# Patient Record
Sex: Male | Born: 1935 | Race: Black or African American | Hispanic: No | Marital: Married | Smoking: Former smoker
Health system: Southern US, Community
[De-identification: ages and names within clinical notes are randomized; demographics above are authoritative.]

## PROBLEM LIST (undated history)

## (undated) DIAGNOSIS — E7800 Pure hypercholesterolemia, unspecified: Secondary | ICD-10-CM

## (undated) DIAGNOSIS — I10 Essential (primary) hypertension: Secondary | ICD-10-CM

## (undated) DIAGNOSIS — N402 Nodular prostate without lower urinary tract symptoms: Secondary | ICD-10-CM

## (undated) HISTORY — DX: Essential (primary) hypertension: I10

## (undated) HISTORY — DX: Pure hypercholesterolemia, unspecified: E78.00

## (undated) HISTORY — DX: Nodular prostate without lower urinary tract symptoms: N40.2

---
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
## 1997-07-28 ENCOUNTER — Ambulatory Visit (HOSPITAL_COMMUNITY): Admission: RE | Admit: 1997-07-28 | Discharge: 1997-07-28 | Payer: Self-pay | Admitting: Internal Medicine

## 2001-03-25 ENCOUNTER — Ambulatory Visit (HOSPITAL_COMMUNITY): Admission: RE | Admit: 2001-03-25 | Discharge: 2001-03-25 | Payer: Self-pay | Admitting: Orthopedic Surgery

## 2001-03-25 ENCOUNTER — Encounter: Payer: Self-pay | Admitting: Orthopedic Surgery

## 2001-08-06 ENCOUNTER — Encounter: Payer: Self-pay | Admitting: Internal Medicine

## 2001-08-06 ENCOUNTER — Encounter: Admission: RE | Admit: 2001-08-06 | Discharge: 2001-08-06 | Payer: Self-pay | Admitting: Internal Medicine

## 2001-11-06 ENCOUNTER — Ambulatory Visit (HOSPITAL_COMMUNITY): Admission: RE | Admit: 2001-11-06 | Discharge: 2001-11-06 | Payer: Self-pay | Admitting: Gastroenterology

## 2005-07-09 ENCOUNTER — Encounter: Admission: RE | Admit: 2005-07-09 | Discharge: 2005-07-09 | Payer: Self-pay | Admitting: Internal Medicine

## 2005-07-13 ENCOUNTER — Encounter: Admission: RE | Admit: 2005-07-13 | Discharge: 2005-07-13 | Payer: Self-pay | Admitting: Internal Medicine

## 2006-08-30 ENCOUNTER — Encounter: Payer: Self-pay | Admitting: Cardiovascular Disease

## 2010-08-16 ENCOUNTER — Ambulatory Visit
Admission: RE | Admit: 2010-08-16 | Discharge: 2010-08-16 | Disposition: A | Discharge status: Home / Self Care | Payer: Medicare Other | Source: Ambulatory Visit | Attending: Internal Medicine | Admitting: Internal Medicine

## 2010-08-16 ENCOUNTER — Other Ambulatory Visit: Payer: Self-pay | Admitting: Internal Medicine

## 2010-08-16 DIAGNOSIS — R05 Cough: Secondary | ICD-10-CM

## 2010-08-19 ENCOUNTER — Emergency Department (HOSPITAL_COMMUNITY)
Admission: EM | Admit: 2010-08-19 | Discharge: 2010-08-19 | Disposition: A | Discharge status: Home / Self Care | Payer: Medicare Other | Attending: Emergency Medicine | Admitting: Emergency Medicine

## 2010-08-19 DIAGNOSIS — L2989 Other pruritus: Secondary | ICD-10-CM | POA: Insufficient documentation

## 2010-08-19 DIAGNOSIS — T63481A Toxic effect of venom of other arthropod, accidental (unintentional), initial encounter: Secondary | ICD-10-CM | POA: Insufficient documentation

## 2010-08-19 DIAGNOSIS — R21 Rash and other nonspecific skin eruption: Secondary | ICD-10-CM | POA: Insufficient documentation

## 2010-08-19 DIAGNOSIS — I10 Essential (primary) hypertension: Secondary | ICD-10-CM | POA: Insufficient documentation

## 2010-08-19 DIAGNOSIS — R229 Localized swelling, mass and lump, unspecified: Secondary | ICD-10-CM | POA: Insufficient documentation

## 2010-08-19 DIAGNOSIS — L298 Other pruritus: Secondary | ICD-10-CM | POA: Insufficient documentation

## 2010-08-19 DIAGNOSIS — T6391XA Toxic effect of contact with unspecified venomous animal, accidental (unintentional), initial encounter: Secondary | ICD-10-CM | POA: Insufficient documentation

## 2011-04-11 ENCOUNTER — Encounter: Payer: Self-pay | Admitting: *Deleted

## 2013-01-12 ENCOUNTER — Encounter: Payer: Self-pay | Admitting: Cardiovascular Disease

## 2013-08-11 ENCOUNTER — Other Ambulatory Visit: Payer: Self-pay | Admitting: Internal Medicine

## 2013-08-11 ENCOUNTER — Ambulatory Visit
Admission: RE | Admit: 2013-08-11 | Discharge: 2013-08-11 | Disposition: A | Discharge status: Home / Self Care | Payer: Medicare Other | Source: Ambulatory Visit | Attending: Internal Medicine | Admitting: Internal Medicine

## 2013-08-11 DIAGNOSIS — R059 Cough, unspecified: Secondary | ICD-10-CM

## 2013-08-11 DIAGNOSIS — R05 Cough: Secondary | ICD-10-CM

## 2014-04-21 DIAGNOSIS — Z0000 Encounter for general adult medical examination without abnormal findings: Secondary | ICD-10-CM | POA: Diagnosis not present

## 2016-05-15 ENCOUNTER — Other Ambulatory Visit: Payer: Self-pay | Admitting: Internal Medicine

## 2016-05-15 DIAGNOSIS — R519 Headache, unspecified: Secondary | ICD-10-CM

## 2016-05-15 DIAGNOSIS — R51 Headache: Principal | ICD-10-CM

## 2016-05-18 ENCOUNTER — Ambulatory Visit
Admission: RE | Admit: 2016-05-18 | Discharge: 2016-05-18 | Disposition: A | Discharge status: Home / Self Care | Payer: Medicare Other | Source: Ambulatory Visit | Attending: Internal Medicine | Admitting: Internal Medicine

## 2016-05-18 DIAGNOSIS — R519 Headache, unspecified: Secondary | ICD-10-CM

## 2016-05-18 DIAGNOSIS — R51 Headache: Principal | ICD-10-CM

## 2016-10-29 ENCOUNTER — Ambulatory Visit (INDEPENDENT_AMBULATORY_CARE_PROVIDER_SITE_OTHER): Payer: Self-pay | Admitting: Orthopaedic Surgery

## 2018-03-17 ENCOUNTER — Ambulatory Visit
Admission: RE | Admit: 2018-03-17 | Discharge: 2018-03-17 | Disposition: A | Discharge status: Home / Self Care | Payer: Medicare Other | Source: Ambulatory Visit | Attending: Internal Medicine | Admitting: Internal Medicine

## 2018-03-17 ENCOUNTER — Other Ambulatory Visit: Payer: Self-pay | Admitting: Internal Medicine

## 2018-03-17 DIAGNOSIS — M25551 Pain in right hip: Secondary | ICD-10-CM

## 2019-06-30 ENCOUNTER — Other Ambulatory Visit: Payer: Self-pay

## 2019-06-30 ENCOUNTER — Other Ambulatory Visit: Payer: Self-pay | Admitting: Internal Medicine

## 2019-06-30 ENCOUNTER — Ambulatory Visit
Admission: RE | Admit: 2019-06-30 | Discharge: 2019-06-30 | Disposition: A | Discharge status: Home / Self Care | Payer: Medicare Other | Source: Ambulatory Visit | Attending: Internal Medicine | Admitting: Internal Medicine

## 2019-06-30 DIAGNOSIS — M25552 Pain in left hip: Secondary | ICD-10-CM

## 2019-10-13 ENCOUNTER — Ambulatory Visit: Payer: Medicare Other | Attending: Critical Care Medicine

## 2019-10-13 DIAGNOSIS — Z23 Encounter for immunization: Secondary | ICD-10-CM

## 2019-10-13 NOTE — Progress Notes (Signed)
° °  Covid-19 Vaccination Clinic  Name:  Corey Kelley    MRN: [id number] DOB: Nov 16, 1927  10/13/2019  [name] was observed post Covid-19 immunization for 15 minutes without incident. He was provided with Vaccine Information Sheet and instruction to access the V-Safe system.   [name] was instructed to call 911 with any severe reactions post vaccine:  Difficulty breathing   Swelling of face and throat   A fast heartbeat   A bad rash all over body   Dizziness and weakness

## 2020-01-25 ENCOUNTER — Other Ambulatory Visit: Payer: Self-pay

## 2020-01-25 ENCOUNTER — Emergency Department (HOSPITAL_COMMUNITY)
Admission: EM | Admit: 2020-01-25 | Discharge: 2020-01-26 | Disposition: A | Discharge status: Home / Self Care | Payer: Medicare Other | Attending: Emergency Medicine | Admitting: Emergency Medicine

## 2020-01-25 ENCOUNTER — Emergency Department (HOSPITAL_COMMUNITY): Payer: Medicare Other

## 2020-01-25 DIAGNOSIS — Z20822 Contact with and (suspected) exposure to covid-19: Secondary | ICD-10-CM | POA: Diagnosis not present

## 2020-01-25 DIAGNOSIS — W108XXA Fall (on) (from) other stairs and steps, initial encounter: Secondary | ICD-10-CM | POA: Diagnosis not present

## 2020-01-25 DIAGNOSIS — K5641 Fecal impaction: Secondary | ICD-10-CM | POA: Insufficient documentation

## 2020-01-25 DIAGNOSIS — I700 Atherosclerosis of aorta: Secondary | ICD-10-CM | POA: Diagnosis not present

## 2020-01-25 DIAGNOSIS — Z9181 History of falling: Secondary | ICD-10-CM | POA: Diagnosis not present

## 2020-01-25 DIAGNOSIS — I6782 Cerebral ischemia: Secondary | ICD-10-CM | POA: Insufficient documentation

## 2020-01-25 DIAGNOSIS — R2681 Unsteadiness on feet: Secondary | ICD-10-CM | POA: Diagnosis not present

## 2020-01-25 DIAGNOSIS — I10 Essential (primary) hypertension: Secondary | ICD-10-CM | POA: Insufficient documentation

## 2020-01-25 DIAGNOSIS — S3992XA Unspecified injury of lower back, initial encounter: Secondary | ICD-10-CM | POA: Diagnosis present

## 2020-01-25 DIAGNOSIS — Z87891 Personal history of nicotine dependence: Secondary | ICD-10-CM | POA: Insufficient documentation

## 2020-01-25 DIAGNOSIS — S32049A Unspecified fracture of fourth lumbar vertebra, initial encounter for closed fracture: Secondary | ICD-10-CM | POA: Insufficient documentation

## 2020-01-25 DIAGNOSIS — Z79899 Other long term (current) drug therapy: Secondary | ICD-10-CM | POA: Insufficient documentation

## 2020-01-25 DIAGNOSIS — R630 Anorexia: Secondary | ICD-10-CM | POA: Diagnosis not present

## 2020-01-25 NOTE — ED Triage Notes (Signed)
Pt states he has no appetite and has had diarrhea

## 2020-01-26 ENCOUNTER — Emergency Department (HOSPITAL_COMMUNITY): Payer: Medicare Other

## 2020-01-26 LAB — COMPREHENSIVE METABOLIC PANEL
ALT: 19 U/L (ref 0–44)
AST: 74 U/L — ABNORMAL HIGH (ref 15–41)
Albumin: 3.9 g/dL (ref 3.5–5.0)
Alkaline Phosphatase: 66 U/L (ref 38–126)
Anion gap: 16 — ABNORMAL HIGH (ref 5–15)
BUN: 13 mg/dL (ref 8–23)
CO2: 19 mmol/L — ABNORMAL LOW (ref 22–32)
Calcium: 9.6 mg/dL (ref 8.9–10.3)
Chloride: 102 mmol/L (ref 98–111)
Creatinine, Ser: 1.05 mg/dL (ref 0.61–1.24)
GFR, Estimated: >60 mL/min (ref >60)
Glucose, Bld: 87 mg/dL (ref 70–99)
Potassium: 4 mmol/L (ref 3.5–5.1)
Sodium: 137 mmol/L (ref 135–145)
Total Bilirubin: 2.1 mg/dL — ABNORMAL HIGH (ref 0.3–1.2)
Total Protein: 7 g/dL (ref 6.5–8.1)

## 2020-01-26 LAB — DIFFERENTIAL
Abs Immature Granulocytes: 0.07 10*3/uL (ref 0.00–0.07)
Basophils Absolute: 0 10*3/uL (ref 0.0–0.1)
Basophils Relative: 0 %
Eosinophils Absolute: 0 10*3/uL (ref 0.0–0.5)
Eosinophils Relative: 0 %
Immature Granulocytes: 1 %
Lymphocytes Relative: 4 %
Lymphs Abs: 0.7 10*3/uL (ref 0.7–4.0)
Monocytes Absolute: 0.9 10*3/uL (ref 0.1–1.0)
Monocytes Relative: 6 %
Neutro Abs: 13.6 10*3/uL — ABNORMAL HIGH (ref 1.7–7.7)
Neutrophils Relative %: 89 %

## 2020-01-26 LAB — CBC
HCT: 50.6 % (ref 39.0–52.0)
Hemoglobin: 17.5 g/dL — ABNORMAL HIGH (ref 13.0–17.0)
MCH: 33.1 pg (ref 26.0–34.0)
MCHC: 34.6 g/dL (ref 30.0–36.0)
MCV: 95.8 fL (ref 80.0–100.0)
Platelets: 215 10*3/uL (ref 150–400)
RBC: 5.28 MIL/uL (ref 4.22–5.81)
RDW: 13.7 % (ref 11.5–15.5)
WBC: 15.3 10*3/uL — ABNORMAL HIGH (ref 4.0–10.5)
nRBC: 0 % (ref 0.0–0.2)

## 2020-01-26 LAB — APTT: aPTT: 33 seconds (ref 24–36)

## 2020-01-26 LAB — PROTIME-INR
INR: 1.1 (ref 0.8–1.2)
Prothrombin Time: 13.7 seconds (ref 11.4–15.2)

## 2020-01-26 LAB — SARS CORONAVIRUS 2 BY RT PCR (HOSPITAL ORDER, PERFORMED IN ~~LOC~~ HOSPITAL LAB): SARS Coronavirus 2: NEGATIVE

## 2020-01-26 MED ORDER — HYDROCODONE-ACETAMINOPHEN 5-325 MG PO TABS
1.0000 | ORAL_TABLET | Freq: Four times a day (QID) | ORAL | 0 refills | Status: DC | PRN
Start: 2020-01-26 — End: 2020-01-26

## 2020-01-26 MED ORDER — HYDROCODONE-ACETAMINOPHEN 5-325 MG PO TABS
1.0000 | ORAL_TABLET | Freq: Once | ORAL | Status: AC
  Administered 2020-01-26: 1 via ORAL
  Filled 2020-01-26: qty 1

## 2020-01-26 MED ORDER — HYDROCODONE-ACETAMINOPHEN 5-325 MG PO TABS: 1.0000 | ORAL_TABLET | Freq: Four times a day (QID) | ORAL | 0 refills | Status: AC | PRN

## 2020-01-26 MED ORDER — IOHEXOL 300 MG/ML  SOLN: 100.0000 mL | Freq: Once | INTRAMUSCULAR | Status: DC | PRN

## 2020-01-26 MED ORDER — IOHEXOL 300 MG/ML  SOLN
100.0000 mL | Freq: Once | INTRAMUSCULAR | Status: AC | PRN
  Administered 2020-01-26: 100 mL via INTRAVENOUS

## 2020-01-26 NOTE — Evaluation (Signed)
Physical Therapy Evaluation Patient Details Name: Corey Kelley MRN: [id number] DOB: 1927-09-23 Today's Date: 01/26/2020   History of Present Illness  Pt is a 87 y.o. M with significant PMH of HTN, HLD who presents after a fall with stable L4 fracture. Per NS, no acute intervention planned.  Clinical Impression  Prior to admission, pt lives with his spouse and is independent. On PT evaluation, pt ambulating 100 feet with a walker at a min guard assist level. Demonstrates improved stability with walker vs without. Does present as a high fall risk based on decreased gait speed, history of falls, and decreased safety awareness. Extensive education provided to pt/pt daughter with demonstration regarding guarding technique (provided gait belt), DME recommendations, and cueing to provide pt during mobility. Pt/pt daughter verbalized understanding. See below for recommendations.    Follow Up Recommendations Home health PT;Supervision/Assistance - 24 hour    Equipment Recommendations  Rolling walker with 5" wheels;3in1 (PT)    Recommendations for Other Services       Precautions / Restrictions Precautions Precautions: Fall Restrictions Weight Bearing Restrictions: No      Mobility  Bed Mobility Overal bed mobility: Needs Assistance Bed Mobility: Supine to Sit;Sit to Supine     Supine to sit: Min guard Sit to supine: Min assist   General bed mobility comments: Min guard-minA for supine <> sit on stretcher in ED    Transfers Overall transfer level: Needs assistance Equipment used: Rolling walker (2 wheeled);None Transfers: Sit to/from Stand Sit to Stand: Min guard         General transfer comment: Min guard to rise to stand from stretcher  Ambulation/Gait Ambulation/Gait assistance: Min assist;Min guard Gait Distance (Feet): 100 Feet Assistive device: Rolling walker (2 wheeled);None Gait Pattern/deviations: Step-through pattern;Decreased stride length;Trunk flexed Gait  velocity: decreased Gait velocity interpretation: <1.8 ft/sec, indicate of risk for recurrent falls General Gait Details: Pt ambulating initial ~5 ft with no AD, and experienced posterior LOB, requiring minA by PT to correct. With RW, pt able to progress to min guard assist level. Cues provided for walker proximity, keeping feet on inside of walker, and segmental turning  Stairs            Wheelchair Mobility    Modified Rankin (Stroke Patients Only)       Balance Overall balance assessment: Needs assistance Sitting-balance support: Feet supported Sitting balance-Leahy Scale: Good     Standing balance support: No upper extremity supported;During functional activity Standing balance-Leahy Scale: Poor                               Pertinent Vitals/Pain Pain Assessment: No/denies pain    Home Living Family/patient expects to be discharged to:: Private residence Living Arrangements: Spouse/significant other Available Help at Discharge: Family;Available 24 hours/day Type of Home: House Home Access: Stairs to enter Entrance Stairs-Rails: Psychiatric nurse of Steps: 3 Home Layout: One level Home Equipment: Clinical cytogeneticist - 2 wheels;Cane - single point      Prior Function Level of Independence: Independent               Hand Dominance        Extremity/Trunk Assessment   Upper Extremity Assessment Upper Extremity Assessment: Overall WFL for tasks assessed    Lower Extremity Assessment Lower Extremity Assessment: Overall WFL for tasks assessed    Cervical / Trunk Assessment Cervical / Trunk Assessment: Kyphotic  Communication   Communication: No difficulties  Cognition  Arousal/Alertness: Awake/alert Behavior During Therapy: WFL for tasks assessed/performed Overall Cognitive Status: Impaired/Different from baseline Area of Impairment: Safety/judgement                         Safety/Judgement: Decreased  awareness of safety;Decreased awareness of deficits     General Comments: A&Ox4, safety cues provided      General Comments      Exercises     Assessment/Plan    PT Assessment Patient needs continued PT services  PT Problem List Decreased strength;Decreased activity tolerance;Decreased balance;Decreased mobility;Decreased safety awareness       PT Treatment Interventions DME instruction;Gait training;Functional mobility training;Stair training;Therapeutic activities;Therapeutic exercise;Balance training;Patient/family education    PT Goals (Current goals can be found in the Care Plan section)  Acute Rehab PT Goals Patient Stated Goal: pt daughter would like him to return to his baseline PT Goal Formulation: With patient/family Time For Goal Achievement: 02/09/20 Potential to Achieve Goals: Good    Frequency Min 3X/week   Barriers to discharge        Co-evaluation               AM-PAC PT "6 Clicks" Mobility  Outcome Measure Help needed turning from your back to your side while in a flat bed without using bedrails?: None Help needed moving from lying on your back to sitting on the side of a flat bed without using bedrails?: A Little Help needed moving to and from a bed to a chair (including a wheelchair)?: A Little Help needed standing up from a chair using your arms (e.g., wheelchair or bedside chair)?: A Little Help needed to walk in hospital room?: A Little Help needed climbing 3-5 steps with a railing? : A Lot 6 Click Score: 18    End of Session Equipment Utilized During Treatment: Gait belt Activity Tolerance: Patient tolerated treatment well Patient left: in bed;with call bell/phone within reach;with family/visitor present Nurse Communication: Mobility status PT Visit Diagnosis: Unsteadiness on feet (R26.81);History of falling (Z91.81)    Time: 2025-4270 PT Time Calculation (min) (ACUTE ONLY): 52 min   Charges:   PT Evaluation $PT Eval Moderate  Complexity: 1 Mod PT Treatments $Gait Training: 8-22 mins $Self Care/Home Management: 8-22        Wyona Almas, PT, DPT Acute Rehabilitation Services Pager [phone] Office [phone]   Deno Etienne 01/26/2020, 4:41 PM

## 2020-01-26 NOTE — ED Notes (Signed)
Patient incont of stool. Patient cleaned up and brief changed.

## 2020-01-26 NOTE — ED Notes (Signed)
Nourishment provided.   ?

## 2020-01-26 NOTE — Consult Note (Signed)
   Providing Compassionate, Quality Care - Together  Neurosurgery Consult  Referring physician: Dr. Dina Rich Reason for referral: L4 Fx  Chief Complaint: GLF  History of Present Illness: This is a 87 yo male with a history of HTN, HLD that presents after ground level fall a few days ago where he fell due to balance. Since then he has had a decreased appetite and was brought to ED with small loose BMs. Denies LOC, denies numbness, tingling in the legs or groin, denies saddle anesthesia. Does complain of generalized weakness. CT revealed a L4 SEP fx with ankylosis throughout, mild displacement, but not posterior or middle column involvement. Denies significant back pain, denies back pain when standing, does have some chronic LBP, especially at night.    Medications: I have reviewed the patient's current medications. Allergies: No Known Allergies  History reviewed. No pertinent family history. Social History:  has no history on file for tobacco use, alcohol use, and drug use.  ROS: Reviewed, pos/neg in HPI  Physical Exam:  Vital signs in last 24 hours: Temp:  [98 F (36.7 C)-98.3 F (36.8 C)] 98 F (36.7 C) (07/25 1814) Pulse Rate:  [58-128] 65 (07/26 0746) Resp:  [11-18] 14 (07/26 0217) BP: (138-182)/(65-125) 153/88 (07/26 0700) SpO2:  [91 %-98 %] 96 % (07/26 0746) PE: AOx3 PERRLA EOMI Face symmetric NAD SILT BUE 4+/5 BLE 4+/5 Non TTP L spine   Impression/Assessment:  87 yo M with:  1. L4 Fracture, stable  -slightly displaced without malalignment, no middle or post column involvement  Plan:  -pain control -no acute intervention -no need for a brace -will have patient f/u 2-3 weeks for eval    Thank you for allowing me to participate in this patient's care.  Please do not hesitate to call with questions or concerns.   Elwin Sleight, [facility] Cell: [phone]

## 2020-01-26 NOTE — ED Notes (Signed)
Discharge instructions communicated with daughter [name], discussed discharge disposition and plans.

## 2020-01-26 NOTE — ED Notes (Signed)
Help get patient on the bed patient is resting with call bed in reach

## 2020-01-26 NOTE — Discharge Instructions (Addendum)
You have been seen and discharged from the emergency department.  You were found to have a fecal impaction as well as an L4 fracture.  Follow-up with your primary provider and listed neurosurgeon for reevaluation.  Take pain medicine as directed.  Do not mix with alcohol.  Do not take this medication and do any heavy activity or drive until you know how it affects you.  It may make you drowsy.  There is Tylenol in this pain medicine.  You will also need to take over-the-counter medications like stool softener/MiraLAX daily to avoid any constipation from the pain medicine.  Take home medications as prescribed. If you have any worsening symptoms or further concerns for health please return to an emergency department for further evaluation.

## 2020-01-26 NOTE — ED Notes (Signed)
Patient cleaned up and brief changed.

## 2020-01-26 NOTE — ED Provider Notes (Signed)
[facility] EMERGENCY DEPARTMENT Provider Note   CSN: [id number] Arrival date & time: 01/25/20  2323     History Chief Complaint  Patient presents with  . Anorexia    Corey Kelley is a 87 y.o. male.  HPI   87 year old male with past medical history of HTN, HLD presents the emergency department with decreased appetite and fall.  Patient states yesterday when he was trying to go up the steps he lost his balance and fell back onto his bottom.  Did not hit his head, had no loss of consciousness, no syncope.  He states all day yesterday and today he has not had anything to eat or drink because he has had no appetite.  He denies any fever, chest pain/shortness of breath/cough, nausea/vomiting.  States he has had small loose watery bowel movements but denies any abdominal pain.  Past Medical History:  Diagnosis Date  . Hypercholesteremia   . Hypertension   . Prostate nodule     There are no problems to display for this patient.       Family History  Problem Relation Age of Onset  . Hypertension Other   . Lung cancer Other   . Coronary artery disease Other   . Heart attack Other     Social History   Tobacco Use  . Smoking status: Former Smoker    Packs/day: 0.50    Years: 4.50    Pack years: 2.25    Quit date: 01/01/1966    Years since quitting: 54.1  Substance Use Topics  . Alcohol use: No    Home Medications Prior to Admission medications   Medication Sig Start Date End Date Taking? Authorizing Provider  hydrochlorothiazide (HYDRODIURIL) 25 MG tablet Take 25 mg by mouth daily.    [provider]  lisinopril (PRINIVIL,ZESTRIL) 20 MG tablet Take 20 mg by mouth daily.    [provider]  lovastatin (MEVACOR) 20 MG tablet Take 20 mg by mouth at bedtime.    [provider]    Allergies    Patient has no known allergies.  Review of Systems   Review of Systems  Constitutional: Positive for appetite change. Negative for  chills and fever.  HENT: Negative for congestion.   Eyes: Negative for visual disturbance.  Respiratory: Negative for cough and shortness of breath.   Cardiovascular: Negative for chest pain.  Gastrointestinal: Positive for diarrhea. Negative for abdominal distention, abdominal pain and vomiting.  Genitourinary: Negative for dysuria.  Musculoskeletal:       +lower back and right hip pain  Skin: Negative for rash.  Neurological: Negative for headaches.    Physical Exam Updated Vital Signs BP (!) 166/91   Pulse 92   Temp (!) 97.5 F (36.4 C) (Oral)   Resp (!) 22   Wt 74.8 kg   SpO2 95%   Physical Exam Vitals and nursing note reviewed.  Constitutional:      Appearance: Normal appearance. He is not toxic-appearing.  HENT:     Head: Normocephalic.     Mouth/Throat:     Mouth: Mucous membranes are moist.  Eyes:     Pupils: Pupils are equal, round, and reactive to light.  Cardiovascular:     Rate and Rhythm: Normal rate.  Pulmonary:     Effort: Pulmonary effort is normal. No respiratory distress.  Abdominal:     Palpations: Abdomen is soft.     Tenderness: There is no abdominal tenderness. There is no guarding.  Musculoskeletal:  General: No deformity.     Comments: No TTP of the right hip, pelvis stable, mild lower back TTP  Skin:    General: Skin is warm.  Neurological:     Mental Status: He is alert and oriented to person, place, and time. Mental status is at baseline.     Comments: Moves BLE with equal strength  Psychiatric:        Mood and Affect: Mood normal.     ED Results / Procedures / Treatments   Labs (all labs ordered are listed, but only abnormal results are displayed) Labs Reviewed  CBC - Abnormal; Notable for the following components:      Result Value   WBC 15.3 (*)    Hemoglobin 17.5 (*)    All other components within normal limits  DIFFERENTIAL - Abnormal; Notable for the following components:   Neutro Abs 13.6 (*)    All other  components within normal limits  COMPREHENSIVE METABOLIC PANEL - Abnormal; Notable for the following components:   CO2 19 (*)    AST 74 (*)    Total Bilirubin 2.1 (*)    Anion gap 16 (*)    All other components within normal limits  SARS CORONAVIRUS 2 BY RT PCR (HOSPITAL ORDER, [facility])  PROTIME-INR  APTT  CBG MONITORING, ED    EKG EKG Interpretation  Date/Time:  Monday January 25 2020 23:41:07 EST Ventricular Rate:  107 PR Interval:  178 QRS Duration: 80 QT Interval:  332 QTC Calculation: 443 R Axis:   -55 Text Interpretation: Sinus tachycardia with occasional Premature ventricular complexes and Fusion complexes Left axis deviation Minimal voltage criteria for LVH, may be normal variant ( R in aVL ) Inferior infarct , age undetermined Anterior infarct , age undetermined Abnormal ECG No old tracing to compare Confirmed by Delora Fuel ([id number]) on 01/26/2020 12:20:07 AM   Radiology CT HEAD WO CONTRAST  Result Date: 01/26/2020 CLINICAL DATA:  Encephalopathy with loss of appetite EXAM: CT HEAD WITHOUT CONTRAST TECHNIQUE: Contiguous axial images were obtained from the base of the skull through the vertex without intravenous contrast. COMPARISON:  05/18/2016 FINDINGS: Brain: There is no mass, hemorrhage or extra-axial collection. There is generalized atrophy without lobar predilection. Hypodensity of the white matter is most commonly associated with chronic microvascular disease. Vascular: No abnormal hyperdensity of the major intracranial arteries or dural venous sinuses. No intracranial atherosclerosis. Skull: The visualized skull base, calvarium and extracranial soft tissues are normal. Sinuses/Orbits: No fluid levels or advanced mucosal thickening of the visualized paranasal sinuses. No mastoid or middle ear effusion. The orbits are normal. IMPRESSION: Generalized atrophy and chronic microvascular ischemia without acute intracranial abnormality. Electronically  Signed   By: Ulyses Jarred M.D.   On: 01/26/2020 00:17   CT ABDOMEN PELVIS W CONTRAST  Result Date: 01/26/2020 CLINICAL DATA:  87 year old male with diarrhea.  Loss of appetite. EXAM: CT ABDOMEN AND PELVIS WITH CONTRAST TECHNIQUE: Multidetector CT imaging of the abdomen and pelvis was performed using the standard protocol following bolus administration of intravenous contrast. CONTRAST:  134mL OMNIPAQUE IOHEXOL 300 MG/ML  SOLN COMPARISON:  [facility] CT Abdomen and Pelvis 08/21/2006 and earlier. FINDINGS: Lower chest: Cardiac size at the upper limits of normal. No pericardial effusion. Negative lung bases; mild lower lobe scarring greater on the left. Hepatobiliary: Small chronic benign cysts in the central liver and at the hepatic dome, the largest on series 3, image 12 is stable since 2007. A similar cyst  of the inferior right hepatic lobe near the gallbladder fossa has regressed since 2007 and 2008. Negative gallbladder. No bile duct enlargement. Pancreas: Negative. Spleen: Negative. Adrenals/Urinary Tract: Normal adrenal glands. Nonobstructed kidneys with symmetric enhancing renal cortex. Exophytic right renal lower pole 3.6 cm probable proteinaceous cyst. There is a small heterogeneously enhancing round 16 mm lesion of the left renal midpole posteriorly (series 3, image 23 and coronal image 112). There is no hydroureter. But the urinary bladder is distended with the bladder dome extending to the level of the umbilicus and estimated overall bladder volume of 650 mL. Additionally there is a small left lateral bladder diverticulum (series 3, image 52). No convincing perivesical stranding. Stomach/Bowel: Moderate to large stool ball in the rectum and distal sigmoid (sagittal image 91) with mild circumferential rectal wall thickening, presacral stranding. Redundant but nondilated upstream sigmoid colon. Gas-filled redundant transverse colon. The descending colon is relatively decompressed.  Redundant hepatic flexure contains stool. Normal appendix suspected on coronal image 58. No large bowel inflammation identified outside of the rectum. No small bowel dilatation. No free air or free fluid identified. Decompressed stomach and duodenum. Vascular/Lymphatic: Aortoiliac calcified atherosclerosis. Tortuous abdominal aorta with mild fusiform enlargement but no bona fide aortic aneurysm (coronal image 69). Major arterial structures in the abdomen and pelvis remain patent. No lymphadenopathy. Reproductive: Negative. Other: Mild presacral inflammatory stranding.  No pelvic free fluid. Musculoskeletal: Chronic benign bone island medial right pubic bones is stable since 2008 (series 4, image 69 today). Unhealed fracture of the right anterior superior L4 vertebral body and superior endplate (sagittal image 105 and coronal image 85) with mildly depressed superior endplate fragment. Underlying L4-L5 interbody ankylosis. Substantial vacuum disc at the adjacent L3-L4 disc space. Bulky L3 inferior endplate Schmorl's node. The L3-L4 posterior elements appear to remain intact and normally aligned. Mild paraspinal soft tissue contusion/inflammation about the fractured L4 body (series 3, image 38). No paraspinal fluid collection. No other acute osseous abnormality identified. IMPRESSION: 1. Acute appearing mildly displaced fracture of the right anterior superior L4 vertebral body and superior endplate. This appears posttraumatic. Underlying L4-L5 interbody ankylosis, and the posterior elements of L3 and L4 appear intact and normally aligned, such that this might be a stable fracture. Mild associated paraspinal soft tissue contusion/inflammation. No paraspinal fluid collection. 2. Superimposed Fecal Impaction with mild perirectal inflammation, and distended urinary bladder (650 mL) likely due to secondary bladder outlet obstruction. No hydronephrosis or hydroureter. 3. There is a small solid enhancing 16 mm left renal  midpole mass compatible with a Small Renal Cell Carcinoma. Recommend Urology follow-up. 4. Aortic Atherosclerosis (ICD10-I70.0). Electronically Signed   By: Genevie Ann M.D.   On: 01/26/2020 04:38    Procedures Fecal disimpaction  Date/Time: 01/26/2020 4:21 PM Performed by: Lorelle Gibbs, DO Authorized by: Lorelle Gibbs, DO  Consent: Verbal consent obtained. Consent given by: patient Patient understanding: patient states understanding of the procedure being performed Imaging studies: imaging studies available Patient identity confirmed: verbally with patient Local anesthesia used: no  Anesthesia: Local anesthesia used: no  Sedation: Patient sedated: no  Patient tolerance: patient tolerated the procedure well with no immediate complications Comments: Moderate amount of semi soft brown stool disimpacted.      Medications Ordered in ED Medications  iohexol (OMNIPAQUE) 300 MG/ML solution 100 mL (has no administration in time range)  iohexol (OMNIPAQUE) 300 MG/ML solution 100 mL (100 mLs Intravenous Contrast Given 01/26/20 0352)    ED Course  I have reviewed the triage vital signs  and the nursing notes.  Pertinent labs & imaging results that were available during my care of the patient were reviewed by me and considered in my medical decision making (see chart for details).    MDM Rules/Calculators/A&P                          Patient presented with complaint of diarrhea and a fall with lower back.  CT the abdomen pelvis shows fecal impaction with bladder outlet obstruction.  I was able to manually disimpact the patient, he tolerated well, enema has been ordered.  The CT also identified an L4 fracture, neurosurgery was consulted.  Dr. Reatha Armour reviewed imaging and evaluated the patient.  They recommend ambulation and weightbearing as tolerated, no back brace.  He will follow-up as an outpatient with neurosurgery.  Physical therapy evaluated the patient, they recommend walker and  home care.  Social work and home care order has been placed, daughter is at bedside.  Patient is pending case management and discharge.  Final Clinical Impression(s) / ED Diagnoses Final diagnoses:  None    Rx / DC Orders ED Discharge Orders    None       Lorelle Gibbs, DO 01/26/20 1622

## 2020-01-26 NOTE — ED Notes (Signed)
Pt's wife Inez Catalina, called and updated. Per wife, she was not the one requesting the update, but the daughter was.

## 2020-01-26 NOTE — ED Notes (Signed)
Contacted pt's daughter, York Cerise @ [phone] with an update as well. Per wife and daughter, York Cerise is to be contacted with updates.

## 2020-01-26 NOTE — ED Notes (Signed)
Patient disimpacted by Dr Dina Rich.  Cleaned patient and changed diaper.  No complaints, pt tolerated well.  No needs voiced by pt at this time.

## 2020-01-28 ENCOUNTER — Telehealth: Payer: Self-pay | Admitting: *Deleted

## 2020-01-28 NOTE — Telephone Encounter (Signed)
Pt daughter called regarding rolling walker for dad.  RNCM advised to either PCP as there is not an order to rolling walker at this time.  Daughter called back stating PCP was able to write order and she will pick up from medical supply store.

## 2020-03-22 ENCOUNTER — Ambulatory Visit: Payer: Medicare Other | Admitting: Neurology

## 2020-03-22 ENCOUNTER — Encounter: Payer: Self-pay | Admitting: Neurology

## 2020-03-22 VITALS — BP 129/81 | HR 82 | Ht 67.0 in | Wt 179.0 lb

## 2020-03-22 DIAGNOSIS — R2689 Other abnormalities of gait and mobility: Secondary | ICD-10-CM

## 2020-03-22 DIAGNOSIS — R296 Repeated falls: Secondary | ICD-10-CM

## 2020-03-22 NOTE — Progress Notes (Signed)
Subjective:    Patient ID: Corey Kelley is a 87 y.o. male.  HPI     Corey Age, MD, PhD [facility] [address], [address] [place], [place] [address]  Dear Dr. Mancel Kelley,   I saw your patient, Corey Kelley, upon your kind request in my neurologic clinic today for initial consultation of his gait disorder and recurrent falls.  The patient is accompanied by his younger daughter, Corey Kelley, today.  As you know, Corey Kelley is a 87 year old right-handed gentleman with an underlying medical history of hypertension, hyperlipidemia, prostate nodule, hearing loss, appetite loss, and L4 fracture, who reports very little of his own history as he is very hard of hearing and his daughter provides his history.  She reports that he has had recurrent falls without any apparent reasons.  These falls have occurred in the past few months.  Since his ER visit in January, he has not had any falls.  He did not need surgery for his vertebral fracture. He is supposed to use a walker but does not use it, feels like he does not need one. She reports that he has a new prescription for a walker but they have misplaced the Medicare insurance card and could not fill the prescription for his walker yet.  He lives with his 84 year old wife.  They have 2 daughters, his older daughter is in [place], younger daughter lives in [place] which is about 20 minutes from them.  He drives although his daughter believes he is not safe to drive.  He has an appointment next month to get evaluated for hearing aids.  He does not hydrate very well.  He does not have a great appetite and does not eat very well.  Per daughter, he probably drinks at most 1 bottle of water per day, 16.9 ounce size. He drinks some sweet tea and strawberry lemonade.  He has not had any sudden onset of one-sided weakness or numbness or tingling or droopy face or slurring of speech.  He denies any pain currently.  He has  seen ENT recently for hearing loss and cerumen impaction.  He was seen in the emergency room at [facility] on 01/25/2020 after a fall.  He was found to have fecal impaction and an L4 fracture.  I reviewed the emergency room records.  He had a head CT without contrast on 01/25/2020 and I reviewed the results: IMPRESSION: Generalized atrophy and chronic microvascular ischemia without acute intracranial abnormality.  He reported diarrhea and loss of appetite.  He had a CT of the abdomen and pelvis with contrast on 01/26/2020 and I reviewed the results: IMPRESSION: 1. Acute appearing mildly displaced fracture of the right anterior superior L4 vertebral body and superior endplate. This appears posttraumatic. Underlying L4-L5 interbody ankylosis, and the posterior elements of L3 and L4 appear intact and normally aligned, such that this might be a stable fracture. Mild associated paraspinal soft tissue contusion/inflammation. No paraspinal fluid collection.   2. Superimposed Fecal Impaction with mild perirectal inflammation, and distended urinary bladder (650 mL) likely due to secondary bladder outlet obstruction. No hydronephrosis or hydroureter.   3. There is a small solid enhancing 16 mm left renal midpole mass compatible with a Small Renal Cell Carcinoma. Recommend Urology follow-up.   4. Aortic Atherosclerosis (ICD10-I70.0).  He had a right hip x-ray on 01/26/2020 and I reviewed the results: IMPRESSION: 1. No acute fracture identified about the pelvis. See L4 fracture details on CT earlier today.  2. Excreted IV contrast in the bladder, distal right ureter, posterior urethra. Small continued bladder distension and multiple bilateral bladder diverticula.  He was treated for his fecal impaction by manual disimpaction and enema.  He had a CMP on 01/25/2020 which showed sodium 137, potassium 4.0, glucose 87, BUN 13, creatinine 1.05, AST 74, ALT 19, total bilirubin 2.1.  Covid test was  negative.  His Past Medical History Is Significant For: Past Medical History:  Diagnosis Date  . Hypercholesteremia   . Hypertension   . Prostate nodule     His Past Surgical History Is Significant For:   His Family History Is Significant For: Family History  Problem Relation Kelley of Onset  . Hypertension Other   . Lung cancer Other   . Coronary artery disease Other   . Heart attack Other     His Social History Is Significant For: Social History   Socioeconomic History  . Marital status: Married    Spouse name: Not on file  . Number of children: 2  . Years of education: Not on file  . Highest education level: Not on file  Occupational History  . Occupation: retired  Tobacco Use  . Smoking status: Former Smoker    Packs/day: 0.50    Years: 4.50    Pack years: 2.25    Quit date: 01/01/1966    Years since quitting: 54.2  . Smokeless tobacco: Never Used  Substance and Sexual Activity  . Alcohol use: No  . Drug use: Not on file  . Sexual activity: Not on file  Other Topics Concern  . Not on file  Social History Narrative  . Not on file   Social Determinants of Health   Financial Resource Strain: Not on file  Food Insecurity: Not on file  Transportation Needs: Not on file  Physical Activity: Not on file  Stress: Not on file  Social Connections: Not on file    His Allergies Are:  No Known Allergies:   His Current Medications Are:  Outpatient Encounter Medications as of 03/22/2020  Medication Sig  . amLODipine (NORVASC) 5 MG tablet Take 5 mg by mouth daily.  . diphenoxylate-atropine (LOMOTIL) 2.5-0.025 MG tablet Take by mouth 4 (four) times daily as needed for diarrhea or loose stools.  Marland Kitchen lisinopril (PRINIVIL,ZESTRIL) 20 MG tablet Take 20 mg by mouth daily.  . tamsulosin (FLOMAX) 0.4 MG CAPS capsule Take 0.4 mg by mouth.  . traMADol (ULTRAM) 50 MG tablet Take 50 mg by mouth 3 (three) times daily as needed.  . [DISCONTINUED] hydrochlorothiazide (HYDRODIURIL)  25 MG tablet Take 25 mg by mouth daily.  . [DISCONTINUED] lovastatin (MEVACOR) 20 MG tablet Take 20 mg by mouth at bedtime.   No facility-administered encounter medications on file as of 03/22/2020.  :   Review of Systems:  Out of a complete 14 point review of systems, all are reviewed and negative with the exception of these symptoms as listed below:    Review of Systems  Neurological:       Patient presents today to discuss his falls and gait change.  After his hospitalization in January, he has not had any falls.  Prior to the hospitalization he had multiple falls.  Patient's daughter has noticed shuffling gait.    Objective:  Neurological Exam  Physical Exam Physical Examination:   Vitals:   03/22/20 1032  BP: 129/81  Pulse: 82    General Examination: The patient is a very pleasant 87 y.o. male in no acute distress.  He appears mildly frail and deconditioned, well-groomed.  HEENT: Normocephalic, atraumatic, pupils are equal, round and reactive to light, status post cataract repairs.  He is very hard of hearing.  Face is symmetric with normal facial animation and normal facial sensation, speech is clear without dysarthria, hypophonia or voice tremor.  Airway examination reveals moderate mouth dryness.  Tongue protrudes centrally and palate elevates symmetrically.  No carotid bruits.  Chest: Clear to auscultation without wheezing, rhonchi or crackles noted.  Heart: S1+S2+0, regular and normal without murmurs, rubs or gallops noted.   Abdomen: Soft, non-tender and non-distended with normal bowel sounds appreciated on auscultation.  Extremities: There is no pitting edema in the distal lower extremities bilaterally.  Skin: Warm and dry without trophic changes noted.  Musculoskeletal: exam reveals no obvious joint deformities, tenderness or joint swelling.   Neurologically:  Mental status: The patient is awake, alert and oriented to self, situation, doctor's office.   Communication is difficult because of his severe hearing impairment.   Cranial nerves II - XII are as described above under HEENT exam. In addition: shoulder shrug is normal with equal shoulder height noted. Motor exam: Thin bulk, global strength of 4+ out of 5, no drift, no resting or postural tremor, Romberg is not tested for safety concerns, reflexes are 1+ in the upper extremities and trace in the lower extremities.  Fine motor skills are globally mildly impaired, no decrement in amplitude noted. Cerebellar testing: No dysmetria or intention tremor on finger to nose testing. Heel to shin is unremarkable bilaterally. There is no truncal or gait ataxia.  Sensory exam: intact to light touch in the upper and lower extremities.  Gait, station and balance: He stands with mild difficulty, posture is mildly stooped, not unusual for Kelley.  He walks slowly and cautiously, no walking aid.  No shuffling, he has preserved arm swing.  He turns slowly.  Denies any lightheadedness or vertiginous symptoms upon standing.  Assessment and Plan:  In summary, Helix Lafontaine is a very pleasant 87 y.o.-year old male with an underlying medical history of hypertension, hyperlipidemia, prostate nodule, hearing loss, appetite loss, and L4 fracture, who presents for evaluation of his gait disorder and recent falls.  History and examination do not support a primary, or single neurological cause for his falls.  His gait disorder is likely secondary to combination of factors including prior falls with injuries, suboptimal hydration, deconditioning.  History and examination do not support underlying parkinsonism.  They are reassured in that regard.  Nevertheless, he does have severe hearing loss and fine motor dyscontrol in the mild range, nonspecific and is therefore advised no longer to drive.  He is advised to stand up slowly and get his bearings first, stay better hydrated with water and use his walker at all times.  He has an older  walker at home and they are trying to get a new walker for him.  I do believe he will benefit from hearing aids.  There was some evidence of arthritic changes in his spine.  He had bladder outlet obstruction and severe constipation/obstipation recently.  I think these are all contributors.  We talked about the importance of healthy lifestyle, good nutrition, trying to get enough rest, good hydration and fall precaution. He is advised to follow-up with his audiologist and with your office as scheduled.  I would be happy to see him back in this clinic on an as-needed basis.  I answered all their questions today and the patient and his daughter  were in agreement.   Thank you very much for allowing me to participate in the care of this nice patient. If I can be of any further assistance to you please do not hesitate to call me at [phone].  Sincerely,   Corey Age, MD, PhD

## 2020-03-22 NOTE — Patient Instructions (Addendum)
It was nice to meet you and Desiree today.   I do not believe there is one specific underlying primary neurological reason for your falls. Your gait disorder and falls are likely due to a combination of factors. These factors include: normal aging, degenerative arthritis of your back, prior falls with injuries, suboptimal hydration with water.   You have mild fine motor difficulty with slow movements.  I do not see any evidence of Parkinson's-like changes.  Nevertheless, I do not believe you are safe to drive.  Therefore, I do not recommend that you drive any longer.  Please remember to stand up slowly and get your bearings first turn slowly, no bending down to pick anything, no heavy lifting, be extra careful at night and first thing in the morning. Also, be careful in the Bathroom and the kitchen.   Remember to drink plenty of fluid, eat healthy meals and do not skip any meals. Try to eat protein with a every meal and eat a healthy snack such as fruit or nuts or yogurt in between meals. Try to keep a regular sleep-wake schedule and try to exercise daily, particularly in the form of walking, within your limitations.    Please try to drink at least 6 cups of water per day, at the most, 8 cups. As far as your medications are concerned, I would like to suggest no new medications.   Please use your walker at all times.  I do believe you will benefit from hearing aids.  Please keep your appointment with the audiologist next month and follow-up with your primary care on a regular basis.  We can see you back in this office on an as-needed basis.

## 2021-10-17 ENCOUNTER — Other Ambulatory Visit: Payer: Self-pay | Admitting: Urology

## 2021-10-17 DIAGNOSIS — D3002 Benign neoplasm of left kidney: Secondary | ICD-10-CM

## 2021-11-07 ENCOUNTER — Ambulatory Visit
Admission: RE | Admit: 2021-11-07 | Discharge: 2021-11-07 | Disposition: A | Discharge status: Home / Self Care | Payer: Medicare Other | Source: Ambulatory Visit | Attending: Urology | Admitting: Urology

## 2021-11-07 DIAGNOSIS — D3002 Benign neoplasm of left kidney: Secondary | ICD-10-CM

## 2021-11-07 MED ORDER — GADOPICLENOL 0.5 MMOL/ML IV SOLN
9.0000 mL | Freq: Once | INTRAVENOUS | Status: AC | PRN
  Administered 2021-11-07: 9 mL via INTRAVENOUS

## 2021-12-26 ENCOUNTER — Encounter (HOSPITAL_COMMUNITY): Payer: Self-pay | Admitting: Emergency Medicine

## 2021-12-26 ENCOUNTER — Other Ambulatory Visit: Payer: Self-pay

## 2021-12-26 ENCOUNTER — Observation Stay (HOSPITAL_COMMUNITY): Payer: Medicare Other

## 2021-12-26 ENCOUNTER — Emergency Department (HOSPITAL_COMMUNITY): Payer: Medicare Other

## 2021-12-26 ENCOUNTER — Inpatient Hospital Stay (HOSPITAL_COMMUNITY)
Admission: EM | Admit: 2021-12-26 | Discharge: 2021-12-31 | DRG: 683 | Disposition: A | Discharge status: Skilled Nursing Facility | Payer: Medicare Other | Attending: Internal Medicine | Admitting: Internal Medicine

## 2021-12-26 DIAGNOSIS — I10 Essential (primary) hypertension: Secondary | ICD-10-CM | POA: Diagnosis present

## 2021-12-26 DIAGNOSIS — S80212A Abrasion, left knee, initial encounter: Secondary | ICD-10-CM | POA: Diagnosis present

## 2021-12-26 DIAGNOSIS — E860 Dehydration: Secondary | ICD-10-CM | POA: Diagnosis present

## 2021-12-26 DIAGNOSIS — N400 Enlarged prostate without lower urinary tract symptoms: Secondary | ICD-10-CM | POA: Diagnosis present

## 2021-12-26 DIAGNOSIS — J111 Influenza due to unidentified influenza virus with other respiratory manifestations: Secondary | ICD-10-CM | POA: Diagnosis present

## 2021-12-26 DIAGNOSIS — E7800 Pure hypercholesterolemia, unspecified: Secondary | ICD-10-CM | POA: Diagnosis present

## 2021-12-26 DIAGNOSIS — R600 Localized edema: Secondary | ICD-10-CM | POA: Diagnosis present

## 2021-12-26 DIAGNOSIS — Z23 Encounter for immunization: Secondary | ICD-10-CM

## 2021-12-26 DIAGNOSIS — R296 Repeated falls: Secondary | ICD-10-CM | POA: Diagnosis present

## 2021-12-26 DIAGNOSIS — Z993 Dependence on wheelchair: Secondary | ICD-10-CM

## 2021-12-26 DIAGNOSIS — N401 Enlarged prostate with lower urinary tract symptoms: Secondary | ICD-10-CM | POA: Diagnosis present

## 2021-12-26 DIAGNOSIS — R338 Other retention of urine: Secondary | ICD-10-CM | POA: Diagnosis present

## 2021-12-26 DIAGNOSIS — Z8249 Family history of ischemic heart disease and other diseases of the circulatory system: Secondary | ICD-10-CM

## 2021-12-26 DIAGNOSIS — F0390 Unspecified dementia without behavioral disturbance: Secondary | ICD-10-CM | POA: Diagnosis present

## 2021-12-26 DIAGNOSIS — M6282 Rhabdomyolysis: Secondary | ICD-10-CM | POA: Diagnosis present

## 2021-12-26 DIAGNOSIS — R5381 Other malaise: Secondary | ICD-10-CM | POA: Diagnosis not present

## 2021-12-26 DIAGNOSIS — Z1152 Encounter for screening for COVID-19: Secondary | ICD-10-CM

## 2021-12-26 DIAGNOSIS — J101 Influenza due to other identified influenza virus with other respiratory manifestations: Secondary | ICD-10-CM | POA: Diagnosis present

## 2021-12-26 DIAGNOSIS — E440 Moderate protein-calorie malnutrition: Secondary | ICD-10-CM | POA: Insufficient documentation

## 2021-12-26 DIAGNOSIS — N179 Acute kidney failure, unspecified: Secondary | ICD-10-CM | POA: Diagnosis not present

## 2021-12-26 DIAGNOSIS — Z801 Family history of malignant neoplasm of trachea, bronchus and lung: Secondary | ICD-10-CM

## 2021-12-26 DIAGNOSIS — R531 Weakness: Secondary | ICD-10-CM | POA: Diagnosis not present

## 2021-12-26 DIAGNOSIS — Y92009 Unspecified place in unspecified non-institutional (private) residence as the place of occurrence of the external cause: Secondary | ICD-10-CM

## 2021-12-26 DIAGNOSIS — E876 Hypokalemia: Secondary | ICD-10-CM | POA: Diagnosis present

## 2021-12-26 DIAGNOSIS — Z87891 Personal history of nicotine dependence: Secondary | ICD-10-CM

## 2021-12-26 DIAGNOSIS — W1830XA Fall on same level, unspecified, initial encounter: Secondary | ICD-10-CM | POA: Diagnosis present

## 2021-12-26 DIAGNOSIS — K56609 Unspecified intestinal obstruction, unspecified as to partial versus complete obstruction: Secondary | ICD-10-CM | POA: Diagnosis present

## 2021-12-26 LAB — COMPREHENSIVE METABOLIC PANEL
ALT: 25 U/L (ref 0–44)
AST: 92 U/L — ABNORMAL HIGH (ref 15–41)
Albumin: 3.7 g/dL (ref 3.5–5.0)
Alkaline Phosphatase: 47 U/L (ref 38–126)
Anion gap: 8 (ref 5–15)
BUN: 21 mg/dL (ref 8–23)
CO2: 25 mmol/L (ref 22–32)
Calcium: 9.1 mg/dL (ref 8.9–10.3)
Chloride: 101 mmol/L (ref 98–111)
Creatinine, Ser: 1.35 mg/dL — ABNORMAL HIGH (ref 0.61–1.24)
GFR, Estimated: 49 mL/min — ABNORMAL LOW (ref >60)
Glucose, Bld: 112 mg/dL — ABNORMAL HIGH (ref 70–99)
Potassium: 3.4 mmol/L — ABNORMAL LOW (ref 3.5–5.1)
Sodium: 134 mmol/L — ABNORMAL LOW (ref 135–145)
Total Bilirubin: 1.1 mg/dL (ref 0.3–1.2)
Total Protein: 6.8 g/dL (ref 6.5–8.1)

## 2021-12-26 LAB — CBC WITH DIFFERENTIAL/PLATELET
Abs Immature Granulocytes: 0.02 10*3/uL (ref 0.00–0.07)
Basophils Absolute: 0 10*3/uL (ref 0.0–0.1)
Basophils Relative: 0 %
Eosinophils Absolute: 0 10*3/uL (ref 0.0–0.5)
Eosinophils Relative: 0 %
HCT: 47.3 % (ref 39.0–52.0)
Hemoglobin: 16.2 g/dL (ref 13.0–17.0)
Immature Granulocytes: 0 %
Lymphocytes Relative: 7 %
Lymphs Abs: 0.5 10*3/uL — ABNORMAL LOW (ref 0.7–4.0)
MCH: 33.1 pg (ref 26.0–34.0)
MCHC: 34.2 g/dL (ref 30.0–36.0)
MCV: 96.7 fL (ref 80.0–100.0)
Monocytes Absolute: 0.8 10*3/uL (ref 0.1–1.0)
Monocytes Relative: 10 %
Neutro Abs: 6.5 10*3/uL (ref 1.7–7.7)
Neutrophils Relative %: 83 %
Platelets: 163 10*3/uL (ref 150–400)
RBC: 4.89 MIL/uL (ref 4.22–5.81)
RDW: 13.5 % (ref 11.5–15.5)
WBC: 7.9 10*3/uL (ref 4.0–10.5)
nRBC: 0 % (ref 0.0–0.2)

## 2021-12-26 LAB — LACTIC ACID, PLASMA: Lactic Acid, Venous: 1.8 mmol/L (ref 0.5–1.9)

## 2021-12-26 LAB — APTT: aPTT: 32 seconds (ref 24–36)

## 2021-12-26 LAB — PROTIME-INR
INR: 1.1 (ref 0.8–1.2)
Prothrombin Time: 14.2 seconds (ref 11.4–15.2)

## 2021-12-26 LAB — RESP PANEL BY RT-PCR (RSV, FLU A&B, COVID)  RVPGX2
Influenza A by PCR: POSITIVE — AB
Influenza B by PCR: NEGATIVE
Resp Syncytial Virus by PCR: NEGATIVE
SARS Coronavirus 2 by RT PCR: NEGATIVE

## 2021-12-26 MED ORDER — OSELTAMIVIR PHOSPHATE 30 MG PO CAPS
30.0000 mg | ORAL_CAPSULE | Freq: Two times a day (BID) | ORAL | Status: DC
  Administered 2021-12-27 – 2021-12-29 (×6): 30 mg via ORAL
  Filled 2021-12-26 (×8): qty 1

## 2021-12-26 MED ORDER — LACTATED RINGERS IV BOLUS (SEPSIS)
500.0000 mL | Freq: Once | INTRAVENOUS | Status: AC
  Administered 2021-12-26: 500 mL via INTRAVENOUS

## 2021-12-26 MED ORDER — POTASSIUM CHLORIDE 10 MEQ/100ML IV SOLN
10.0000 meq | INTRAVENOUS | Status: AC
  Administered 2021-12-27: 10 meq via INTRAVENOUS
  Filled 2021-12-26: qty 100

## 2021-12-26 NOTE — Assessment & Plan Note (Addendum)
Due to dehydration  Obtain urine electrolytes Follow renal function  Assess for urinary retention  Hold lisinopril

## 2021-12-26 NOTE — Subjective & Objective (Signed)
Pt have had 3 falls today had to call EMS to get him up from the ground Not sure if he hit his head BP 146/72 Denies CP , SOB or fever but has been coughing   Has a right leg swelling

## 2021-12-26 NOTE — Assessment & Plan Note (Signed)
Allow permissive HTN 

## 2021-12-26 NOTE — ED Triage Notes (Addendum)
Per EMS, Pt from home w/ family has been experiencing increased falls (but none today.)  Family reports pt has had increased weakness and is now only able to walk w/ a walker, when asked how long family states the change happened the response is "5 years."     Negative Stroke screen, pt does display some weakness but is bilateral.  Pt is able to answer some orientation questions however denies any problems and is not exactly sure why he is here.  146/72 94%RA CBG 116

## 2021-12-26 NOTE — H&P (Signed)
Corey Kelley NIO:[id number] DOB: 11-14-1927 DOA: 12/26/2021     PCP: Lorene Dy, MD   Goes to [facility]    Patient arrived to ER on 12/26/21 at 2030 Referred by Attending Deno Etienne, DO   Patient coming from:    home Lives With family    Chief Complaint:   Chief Complaint  Patient presents with   Weakness    HPI: Corey Kelley is a 88 y.o. male with medical history significant of Dementia, HTN, HLD, BPH    Presented with  falls, fatigue Pt have had 3 falls today had to call EMS to get him up from the ground Not sure if he hit his head BP 146/72 Denies CP , SOB or fever but has been coughing   Has a right leg swelling   Family feels that both legs have been swollen for months but right more than left  It has been interfering with his walking  Pt unable to provide hx  But no fever no diarrhea  He used to smoke but no any more does not drink His wife has been sick recently as well   Initial COVID TEST  NEGATIVE POSITIVE,     Lab Results  Component Value Date   [place] 12/26/2021   [place] NEGATIVE 01/26/2020     Regarding pertinent Chronic problems:     Hyperlipidemia -  on statins LOVASTATIN      HTN on Lisinopril, NOrvasc      While in ER: Noted to have AKI with creatinine up to 1.3 mild hypokalemia with potassium down to 3.4 positive for flu A but no evidence of pneumonia   Femur/Tibia/fibula/ foot . No acute fracture or dislocation. 2. Moderate arthritic changes of the right knee. 3. Diffuse subcutaneous edema and soft tissue swelling of the dorsum of the foot.  CT HEAD Chronic atrophic and ischemic changes without acute abnormality   CXR - chronic bronchitic thickening     Following Medications were ordered in ER: Medications  lactated ringers bolus 500 mL (0 mLs Intravenous Stopped 12/26/21 2231)    KUB showing Prominent gas-filled loops of small and large bowel. If there is concern for obstruction CT ___________    ED  Triage Vitals  Enc Vitals Group     BP 12/26/21 2115 137/80     Pulse Rate 12/26/21 2115 (!) 102     Resp 12/26/21 2115 17     Temp 12/26/21 2115 98 F (36.7 C)     Temp Source 12/26/21 2115 Oral     SpO2 12/26/21 2115 98 %     Weight --      Height --      Head Circumference --      Peak Flow --      Pain Score 12/26/21 2054 0     Pain Loc --      Pain Edu? --      Excl. in [place]? --   TMAX(24)@     _________________________________________ Significant initial  Findings: Abnormal Labs Reviewed  RESP PANEL BY RT-PCR (RSV, FLU A&B, COVID)  RVPGX2 - Abnormal; Notable for the following components:      Result Value   Influenza A by PCR POSITIVE (*)    All other components within normal limits  COMPREHENSIVE METABOLIC PANEL - Abnormal; Notable for the following components:   Sodium 134 (*)    Potassium 3.4 (*)    Glucose, Bld 112 (*)    Creatinine, Ser 1.35 (*)  AST 92 (*)    GFR, Estimated 49 (*)    All other components within normal limits  CBC WITH DIFFERENTIAL/PLATELET - Abnormal; Notable for the following components:   Lymphs Abs 0.5 (*)    All other components within normal limits     _________________________ Troponin  ordered ECG: Ordered Personally reviewed and interpreted by me showing: HR :  104 Rhythm:  Sinus tachycardia Borderline prolonged PR interval Inferior infarct, old Baseline wander in lead(s) V1 QTC 454   The recent clinical data is shown below. Vitals:   12/26/21 2115 12/26/21 2130 12/26/21 2215 12/26/21 2230  BP: 137/80 (!) 146/92 137/83 (!) 132/97  Pulse: (!) 102 (!) 105 (!) 101 (!) 102  Resp: 17     Temp: 98 F (36.7 C)     TempSrc: Oral     SpO2: 98% 98% 95% 100%    WBC     Component Value Date/Time   WBC 7.9 12/26/2021 2102   LYMPHSABS 0.5 (L) 12/26/2021 2102   MONOABS 0.8 12/26/2021 2102   EOSABS 0.0 12/26/2021 2102   BASOSABS 0.0 12/26/2021 2102    Lactic Acid, Venous    Component Value Date/Time   LATICACIDVEN 1.8  12/26/2021 2102      UA   ordered     Results for orders placed or performed during the hospital encounter of 12/26/21  Resp panel by RT-PCR (RSV, Flu A&B, Covid) Anterior Nasal Swab     Status: Abnormal   Collection Time: 12/26/21  9:09 PM   Specimen: Anterior Nasal Swab  Result Value Ref Range Status   SARS Coronavirus 2 by RT PCR NEGATIVE NEGATIVE Final         Influenza A by PCR POSITIVE (A) NEGATIVE Final   Influenza B by PCR NEGATIVE NEGATIVE Final         Resp Syncytial Virus by PCR NEGATIVE NEGATIVE Final          _______________________________________________ Hospitalist was called for admission for AKI dehydration in the setting of Influenza A The following Work up has been ordered so far:  Orders Placed This Encounter  Procedures   Urine Culture   Resp panel by RT-PCR (RSV, Flu A&B, Covid) Anterior Nasal Swab   DG Chest Port 1 View   CT Head Wo Contrast   DG Tibia/Fibula Right   DG Foot Complete Right   DG Femur Min 2 Views Right   Lactic acid, plasma   Comprehensive metabolic panel   CBC with Differential   Protime-INR   APTT   Urinalysis, Routine w reflex microscopic   Diet NPO time specified   Cardiac monitoring   Document height and weight   Assess and Document Glasgow Coma Scale   Document vital signs within 1-hour of fluid bolus completion.  Notify provider of abnormal vital signs despite fluid resuscitation.   Refer to Sidebar Report: Sepsis Bundle ED/IP   Notify provider for difficulties obtaining IV access   Initiate Carrier Fluid Protocol   Consult to hospitalist   Pulse oximetry, continuous   ED EKG 12-Lead   EKG 12-Lead   Insert peripheral IV X 1   VAS [place] LOWER EXTREMITY VENOUS (DVT)     OTHER Significant initial  Findings:  labs showing:  Recent Labs  Lab 12/26/21 2102  NA 134*  K 3.4*  CO2 25  GLUCOSE 112*  BUN 21  CREATININE 1.35*  CALCIUM 9.1    Cr  Up from baseline see below Lab Results  Component Value  Date    CREATININE 1.35 (H) 12/26/2021   CREATININE 1.05 01/25/2020    Recent Labs  Lab 12/26/21 2102  AST 92*  ALT 25  ALKPHOS 47  BILITOT 1.1  PROT 6.8  ALBUMIN 3.7   Lab Results  Component Value Date   CALCIUM 9.1 12/26/2021    Plt: Lab Results  Component Value Date   PLT 163 12/26/2021    Recent Labs  Lab 12/26/21 2102  WBC 7.9  NEUTROABS 6.5  HGB 16.2  HCT 47.3  MCV 96.7  PLT 163    HG/HCT  stable      Component Value Date/Time   HGB 16.2 12/26/2021 2102   HCT 47.3 12/26/2021 2102   MCV 96.7 12/26/2021 2102      Cultures: No results found for: "SDES", "SPECREQUEST", "CULT", "REPTSTATUS"   Radiological Exams on Admission: DG Abd 1 View  Result Date: 12/26/2021 CLINICAL DATA:  Weakness.  Constipation EXAM: ABDOMEN - 1 VIEW COMPARISON:  CT abdomen and pelvis 01/26/2020 FINDINGS: Prominent gas-filled loops of small and large bowel without definite dilation. Exam is limited by overlapping structures in the pelvis. No definite acute osseous abnormality. IMPRESSION: Prominent gas-filled loops of small and large bowel. If there is concern for obstruction CT is recommended for further evaluation. Electronically Signed   By: Placido Sou M.D.   On: 12/26/2021 23:50   CT Head Wo Contrast  Result Date: 12/26/2021 CLINICAL DATA:  Recent falls EXAM: CT HEAD WITHOUT CONTRAST TECHNIQUE: Contiguous axial images were obtained from the base of the skull through the vertex without intravenous contrast. RADIATION DOSE REDUCTION: This exam was performed according to the departmental dose-optimization program which includes automated exposure control, adjustment of the mA and/or kV according to patient size and/or use of iterative reconstruction technique. COMPARISON:  01/26/2020 FINDINGS: Brain: No evidence of acute infarction, hemorrhage, hydrocephalus, extra-axial collection or mass lesion/mass effect. Chronic atrophic and ischemic changes are noted. Vascular: No hyperdense vessel or  unexpected calcification. Skull: Normal. Negative for fracture or focal lesion. Sinuses/Orbits: No acute finding. Other: None. IMPRESSION: Chronic atrophic and ischemic changes without acute abnormality. Electronically Signed   By: Inez Catalina M.D.   On: 12/26/2021 22:09   DG Tibia/Fibula Right  Result Date: 12/26/2021 CLINICAL DATA:  Fall and trauma to the right lower extremity. EXAM: RIGHT TIBIA AND FIBULA - 2 VIEW; RIGHT FEMUR 2 VIEWS; RIGHT FOOT COMPLETE - 3+ VIEW COMPARISON:  Right hip radiograph dated 01/26/2020. FINDINGS: Evaluation is limited due to body habitus and soft tissue attenuation. There is no acute fracture or dislocation. There is moderate arthritic changes of the right knee. There is diffuse subcutaneous edema and soft tissue swelling of the dorsum of the foot. No radiopaque foreign object or soft tissue gas. IMPRESSION: 1. No acute fracture or dislocation. 2. Moderate arthritic changes of the right knee. 3. Diffuse subcutaneous edema and soft tissue swelling of the dorsum of the foot. Electronically Signed   By: Anner Crete M.D.   On: 12/26/2021 22:07   DG Foot Complete Right  Result Date: 12/26/2021 CLINICAL DATA:  Fall and trauma to the right lower extremity. EXAM: RIGHT TIBIA AND FIBULA - 2 VIEW; RIGHT FEMUR 2 VIEWS; RIGHT FOOT COMPLETE - 3+ VIEW COMPARISON:  Right hip radiograph dated 01/26/2020. FINDINGS: Evaluation is limited due to body habitus and soft tissue attenuation. There is no acute fracture or dislocation. There is moderate arthritic changes of the right knee. There is diffuse subcutaneous edema and soft tissue swelling of the  dorsum of the foot. No radiopaque foreign object or soft tissue gas. IMPRESSION: 1. No acute fracture or dislocation. 2. Moderate arthritic changes of the right knee. 3. Diffuse subcutaneous edema and soft tissue swelling of the dorsum of the foot. Electronically Signed   By: Anner Crete M.D.   On: 12/26/2021 22:07   DG Femur Min 2  Views Right  Result Date: 12/26/2021 CLINICAL DATA:  Fall and trauma to the right lower extremity. EXAM: RIGHT TIBIA AND FIBULA - 2 VIEW; RIGHT FEMUR 2 VIEWS; RIGHT FOOT COMPLETE - 3+ VIEW COMPARISON:  Right hip radiograph dated 01/26/2020. FINDINGS: Evaluation is limited due to body habitus and soft tissue attenuation. There is no acute fracture or dislocation. There is moderate arthritic changes of the right knee. There is diffuse subcutaneous edema and soft tissue swelling of the dorsum of the foot. No radiopaque foreign object or soft tissue gas. IMPRESSION: 1. No acute fracture or dislocation. 2. Moderate arthritic changes of the right knee. 3. Diffuse subcutaneous edema and soft tissue swelling of the dorsum of the foot. Electronically Signed   By: Anner Crete M.D.   On: 12/26/2021 22:07   DG Chest Port 1 View  Result Date: 12/26/2021 CLINICAL DATA:  Weakness. Questionable sepsis evaluate for abnormality EXAM: PORTABLE CHEST 1 VIEW COMPARISON:  08/11/2013 FINDINGS: Low lung volumes. Stable cardiomediastinal silhouette. Aortic atherosclerotic calcification. Bibasilar atelectasis. Unchanged chronic bronchitic thickening. No displaced rib fractures. IMPRESSION: Unchanged chronic bronchitic thickening. Electronically Signed   By: Placido Sou M.D.   On: 12/26/2021 21:01   _______________________________________________________________________________________________________ Latest  Blood pressure (!) 132/97, pulse (!) 102, temperature 98 F (36.7 C), temperature source Oral, resp. rate 17, SpO2 100 %.   Vitals  labs and radiology finding personally reviewed  Review of Systems:    Pertinent positives include:    fatigue, falls leg swelling   Constitutional:  No weight loss, night sweats, Fevers, chills weight loss  HEENT:  No headaches, Difficulty swallowing,Tooth/dental problems,Sore throat,  No sneezing, itching, ear ache, nasal congestion, post nasal drip,  Cardio-vascular:  No  chest pain, Orthopnea, PND, anasarca, dizziness, palpitations.no Bilateral lower extremity swelling  GI:  No heartburn, indigestion, abdominal pain, nausea, vomiting, diarrhea, change in bowel habits, loss of appetite, melena, blood in stool, hematemesis Resp:  no shortness of breath at rest. No dyspnea on exertion, No excess mucus, no productive cough, No non-productive cough, No coughing up of blood.No change in color of mucus.No wheezing. Skin:  no rash or lesions. No jaundice GU:  no dysuria, change in color of urine, no urgency or frequency. No straining to urinate.  No flank pain.  Musculoskeletal:  No joint pain or no joint swelling. No decreased range of motion. No back pain.  Psych:  No change in mood or affect. No depression or anxiety. No memory loss.  Neuro: no localizing neurological complaints, no tingling, no weakness, no double vision, no gait abnormality, no slurred speech, no confusion  All systems reviewed and apart from [place] all are negative _______________________________________________________________________________________________ Past Medical History:   Past Medical History:  Diagnosis Date   Hypercholesteremia    Hypertension    Prostate nodule      Past Surgical History:  Procedure Laterality Date   HERNIA REPAIR  1950's    Social History:  Ambulatory  walker  wheelchair bound      reports that he quit smoking about 56 years ago. He has a 2.25 pack-year smoking history. He has never used smokeless tobacco. He reports  that he does not drink alcohol. No history on file for drug use.   Family History:  Family History  Problem Relation Age of Onset   Hypertension Other    Lung cancer Other    Coronary artery disease Other    Heart attack Other    ______________________________________________________________________________________________ Allergies: No Known Allergies   Prior to Admission medications   Medication Sig Start Date End Date  Taking? Authorizing Provider  amLODipine (NORVASC) 5 MG tablet Take 5 mg by mouth daily.    [provider]  diphenoxylate-atropine (LOMOTIL) 2.5-0.025 MG tablet Take by mouth 4 (four) times daily as needed for diarrhea or loose stools.    [provider]  lisinopril (PRINIVIL,ZESTRIL) 20 MG tablet Take 20 mg by mouth daily.    [provider]  tamsulosin (FLOMAX) 0.4 MG CAPS capsule Take 0.4 mg by mouth.    [provider]  traMADol (ULTRAM) 50 MG tablet Take 50 mg by mouth 3 (three) times daily as needed.    [provider]    ___________________________________________________________________________________________________ Physical Exam:    12/26/2021   10:30 PM 12/26/2021   10:15 PM 12/26/2021    9:30 PM  Vitals with BMI  Systolic 161 096 045  Diastolic 97 83 92  Pulse 409 101 105     1. General:  in No  Acute distress increased work of breathing   Chronically ill   -appearing 2. Psychological: Alert and   Oriented to self only 3. Head/ENT:   Dry Mucous Membranes                          Head Non traumatic, neck supple                          Poor Dentition 4. SKIN: normal   Skin turgor,  Skin clean Dry and intact no rash 5. Heart: Rapid regular rate and rhythm no  Murmur, no Rub or gallop 6. Lungs: Minimal wheezes no crackles extensive upper airway noises 7. Abdomen: Soft,  non-tender,   distended  bowel sounds decreased 8. Lower extremities: no clubbing, cyanosis, edema R>L     9. Neurologically Grossly intact, moving all 4 extremities equally   10. MSK: Normal range of motion    Chart has been reviewed  ______________________________________________________________________________________________  Assessment/Plan 88 y.o. male with medical history significant of Dementia, HTN, HLD, BPH    Admitted for  AKI dehydration in the setting of Influenza A   Present on Admission:  AKI (acute kidney injury) ([place])  Essential  hypertension  Debility  BPH (benign prostatic hyperplasia)  Influenza  Hypokalemia  Leg edema, right  SBO (small bowel obstruction) (H[facility])     AKI (acute kidney injury) ([place]) Due to dehydration  Obtain urine electrolytes Follow renal function  Assess for urinary retention  Hold lisinopril  Essential hypertension Allow permissive HTN  Debility With recurrent fall will need PT /OT eval prior to dc  BPH (benign prostatic hyperplasia) Continue flomax at 0.4 mg po qday  Influenza Initiate tamiflu 75 mg po bid  Supportive care  CXR showing no infiltrates   No O2 requirement Given increased work of breathing and tachycardia will obtain CTA patient with unilateral edema      Hypokalemia Replace and check mg/ ph replace as needed  Leg edema, right Dopplers ordered,   Plain imaging showing edema no free air  No evidence of infection  SBO (small bowel obstruction) (HCC) Plain imaging worrisome for possible small bowel obstruction will obtain CT of abdomen to further investigate   Other plan as per orders.  DVT prophylaxis:  Lovenox       Code Status:    Code Status: Not on file FULL CODE  as per   family  I had personally discussed CODE STATUS with patient and family     Family Communication:   Family  at  Bedside  plan of care was discussed   with   Son in Sports coach, Daughter, Wife,    Disposition Plan:    Following barriers for discharge:                            Electrolytes corrected                                                            Will need to be able to tolerate PO                                    Would benefit from PT/OT eval prior to DC  Ordered                   Transition of care consulted                   Nutrition    consulted                 Consults called: none  Admission status:  ED Disposition     ED Disposition  Admit   Condition  --   [place]: [place] [[id number]]  Level of Care: Telemetry  [5]  Admit to tele based on following criteria: Other see comments  Comments: aki  May place patient in observation at [facility] or [place] if equivalent level of care is available:: No  Covid Evaluation: Confirmed COVID Negative  Diagnosis: AKI (acute kidney injury) [facility]) [[id number]]  Admitting Physician: Toy Baker [[id number]]  Attending Physician: Toy Baker [[id number]]          Obs    Level of care     tele  For  24H      Lab Results  Component Value Date   [place] 12/26/2021     Precautions: admitted as   Covid Negative      Arrington Yohe 12/27/2021, 12:02 AM    [facility]   after 2 AM please page floor coverage PA If 7AM-7PM, please contact the day team taking care of the patient using [email or web address]   Patient was evaluated in the context of the global COVID-19 pandemic, which necessitated consideration that the patient might be at risk for infection with the SARS-CoV-2 virus that causes COVID-19. Institutional protocols and algorithms that pertain to the evaluation of patients at risk for COVID-19 are in a state of rapid change based on information released by regulatory bodies including the CDC and federal and state organizations. These policies and algorithms were followed during the patient's care.

## 2021-12-26 NOTE — ED Notes (Signed)
Pt has skin tear to left knee. Covered area with non-adherent bandage and tegaderm.

## 2021-12-26 NOTE — ED Provider Notes (Signed)
Corey Kelley Provider Note   CSN: [id number] Arrival date & time: 12/26/21  2030     History  Chief Complaint  Patient presents with   Weakness    Corey Kelley is a 88 y.o. male.  88 yo M with a chief complaints of fatigue.  This is reported by EMS.  Patient has no complaints.  He denies cough congestion or fever denies chest pain denies abdominal pain denies nausea vomiting or diarrhea feels like he has been eating and drinking normally.  Level 5 caveat dementia.        Home Medications Prior to Admission medications   Medication Sig Start Date End Date Taking? Authorizing Provider  amLODipine (NORVASC) 5 MG tablet Take 5 mg by mouth daily.    [provider]  diphenoxylate-atropine (LOMOTIL) 2.5-0.025 MG tablet Take by mouth 4 (four) times daily as needed for diarrhea or loose stools.    [provider]  lisinopril (PRINIVIL,ZESTRIL) 20 MG tablet Take 20 mg by mouth daily.    [provider]  tamsulosin (FLOMAX) 0.4 MG CAPS capsule Take 0.4 mg by mouth.    [provider]  traMADol (ULTRAM) 50 MG tablet Take 50 mg by mouth 3 (three) times daily as needed.    [provider]      Allergies    Patient has no known allergies.    Review of Systems   Review of Systems  Physical Exam Updated Vital Signs BP (!) 132/97   Pulse (!) 102   Temp 98 F (36.7 C) (Oral)   Resp 17   SpO2 100%  Physical Exam Vitals and nursing note reviewed.  Constitutional:      Appearance: He is well-developed.  HENT:     Head: Normocephalic and atraumatic.  Eyes:     Pupils: Pupils are equal, round, and reactive to light.  Neck:     Vascular: No JVD.  Cardiovascular:     Rate and Rhythm: Normal rate and regular rhythm.     Heart sounds: No murmur heard.    No friction rub. No gallop.  Pulmonary:     Effort: No respiratory distress.     Breath sounds: No wheezing.  Abdominal:     General: There is no  distension.     Tenderness: There is no abdominal tenderness. There is no guarding or rebound.  Musculoskeletal:        General: Normal range of motion.     Cervical back: Normal range of motion and neck supple.     Comments: Asymmetric edema to the legs right greater than left.  Abrasion to the left knee.  No obvious bony tenderness on palpation.  Skin:    Coloration: Skin is not pale.     Findings: No rash.  Neurological:     Mental Status: He is alert and oriented to person, place, and time.  Psychiatric:        Behavior: Behavior normal.     ED Results / Procedures / Treatments   Labs (all labs ordered are listed, but only abnormal results are displayed) Labs Reviewed  RESP PANEL BY RT-PCR (RSV, FLU A&B, COVID)  RVPGX2 - Abnormal; Notable for the following components:      Result Value   Influenza A by PCR POSITIVE (*)    All other components within normal limits  COMPREHENSIVE METABOLIC PANEL - Abnormal; Notable for the following components:   Sodium 134 (*)    Potassium 3.4 (*)  Glucose, Bld 112 (*)    Creatinine, Ser 1.35 (*)    AST 92 (*)    GFR, Estimated 49 (*)    All other components within normal limits  CBC WITH DIFFERENTIAL/PLATELET - Abnormal; Notable for the following components:   Lymphs Abs 0.5 (*)    All other components within normal limits  URINE CULTURE  LACTIC ACID, PLASMA  PROTIME-INR  APTT  LACTIC ACID, PLASMA  URINALYSIS, ROUTINE W REFLEX MICROSCOPIC    EKG EKG Interpretation  Date/Time:  Tuesday December 26 2021 21:33:34 EST Ventricular Rate:  102 PR Interval:  209 QRS Duration: 90 QT Interval:  348 QTC Calculation: 454 R Axis:   -55 Text Interpretation: Sinus tachycardia Borderline prolonged PR interval Inferior infarct, old Baseline wander in lead(s) V1 No significant change since last tracing Confirmed by Corey Kelley [phone]) on 12/26/2021 9:35:50 PM  Radiology CT Head Wo Contrast  Result Date: 12/26/2021 CLINICAL DATA:  Recent  falls EXAM: CT HEAD WITHOUT CONTRAST TECHNIQUE: Contiguous axial images were obtained from the base of the skull through the vertex without intravenous contrast. RADIATION DOSE REDUCTION: This exam was performed according to the departmental dose-optimization program which includes automated exposure control, adjustment of the mA and/or kV according to patient size and/or use of iterative reconstruction technique. COMPARISON:  01/26/2020 FINDINGS: Brain: No evidence of acute infarction, hemorrhage, hydrocephalus, extra-axial collection or mass lesion/mass effect. Chronic atrophic and ischemic changes are noted. Vascular: No hyperdense vessel or unexpected calcification. Skull: Normal. Negative for fracture or focal lesion. Sinuses/Orbits: No acute finding. Other: None. IMPRESSION: Chronic atrophic and ischemic changes without acute abnormality. Electronically Signed   By: Inez Catalina M.D.   On: 12/26/2021 22:09   DG Tibia/Fibula Right  Result Date: 12/26/2021 CLINICAL DATA:  Fall and trauma to the right lower extremity. EXAM: RIGHT TIBIA AND FIBULA - 2 VIEW; RIGHT FEMUR 2 VIEWS; RIGHT FOOT COMPLETE - 3+ VIEW COMPARISON:  Right hip radiograph dated 01/26/2020. FINDINGS: Evaluation is limited due to body habitus and soft tissue attenuation. There is no acute fracture or dislocation. There is moderate arthritic changes of the right knee. There is diffuse subcutaneous edema and soft tissue swelling of the dorsum of the foot. No radiopaque foreign object or soft tissue gas. IMPRESSION: 1. No acute fracture or dislocation. 2. Moderate arthritic changes of the right knee. 3. Diffuse subcutaneous edema and soft tissue swelling of the dorsum of the foot. Electronically Signed   By: Anner Crete M.D.   On: 12/26/2021 22:07   DG Foot Complete Right  Result Date: 12/26/2021 CLINICAL DATA:  Fall and trauma to the right lower extremity. EXAM: RIGHT TIBIA AND FIBULA - 2 VIEW; RIGHT FEMUR 2 VIEWS; RIGHT FOOT COMPLETE  - 3+ VIEW COMPARISON:  Right hip radiograph dated 01/26/2020. FINDINGS: Evaluation is limited due to body habitus and soft tissue attenuation. There is no acute fracture or dislocation. There is moderate arthritic changes of the right knee. There is diffuse subcutaneous edema and soft tissue swelling of the dorsum of the foot. No radiopaque foreign object or soft tissue gas. IMPRESSION: 1. No acute fracture or dislocation. 2. Moderate arthritic changes of the right knee. 3. Diffuse subcutaneous edema and soft tissue swelling of the dorsum of the foot. Electronically Signed   By: Anner Crete M.D.   On: 12/26/2021 22:07   DG Femur Min 2 Views Right  Result Date: 12/26/2021 CLINICAL DATA:  Fall and trauma to the right lower extremity. EXAM: RIGHT TIBIA AND FIBULA -  2 VIEW; RIGHT FEMUR 2 VIEWS; RIGHT FOOT COMPLETE - 3+ VIEW COMPARISON:  Right hip radiograph dated 01/26/2020. FINDINGS: Evaluation is limited due to body habitus and soft tissue attenuation. There is no acute fracture or dislocation. There is moderate arthritic changes of the right knee. There is diffuse subcutaneous edema and soft tissue swelling of the dorsum of the foot. No radiopaque foreign object or soft tissue gas. IMPRESSION: 1. No acute fracture or dislocation. 2. Moderate arthritic changes of the right knee. 3. Diffuse subcutaneous edema and soft tissue swelling of the dorsum of the foot. Electronically Signed   By: Anner Crete M.D.   On: 12/26/2021 22:07   DG Chest Port 1 View  Result Date: 12/26/2021 CLINICAL DATA:  Weakness. Questionable sepsis evaluate for abnormality EXAM: PORTABLE CHEST 1 VIEW COMPARISON:  08/11/2013 FINDINGS: Low lung volumes. Stable cardiomediastinal silhouette. Aortic atherosclerotic calcification. Bibasilar atelectasis. Unchanged chronic bronchitic thickening. No displaced rib fractures. IMPRESSION: Unchanged chronic bronchitic thickening. Electronically Signed   By: Placido Sou M.D.   On:  12/26/2021 21:01    Procedures Procedures    Medications Ordered in ED Medications  lactated ringers bolus 500 mL (0 mLs Intravenous Stopped 12/26/21 2231)    ED Course/ Medical Decision Making/ A&P                           Medical Decision Making Amount and/or Complexity of Data Reviewed Labs: ordered. Radiology: ordered. ECG/medicine tests: ordered.   88 yo M with a chief complaint of weakness.  This was reported by EMS.  Patient is unable to provide much history.  On record review the patient has a history of dementia.  Had been seen by a neurologist last year.  His supposed be using a walker at that visit but does not typically.  Will obtain a laboratory evaluation bolus of IV fluids.  Attempt to obtain further history from family.  Patient's daughter has arrived and provides further history.  Tells me that the patient actually has had 3 falls in the past 24 hours.  Has been coughing quite a bit since yesterday.  One of the caregivers had recently come down with the flu.  Had noticed him not wanting to stand on his right leg.  More family has arrived and tells me that he is actually had some swelling in that right leg for months.  Unsure if it is ever been worked up.  Plain film of the femur tib-fib and foot independently interpreted by me without obvious fracture.  CT of the head independently interpreted by me without obvious intracranial hemorrhage.  Patient's flu test is positive.  With further discussion with the family and him having multiple falls will discuss with medicine for possible admission.  The patients results and plan were reviewed and discussed.   Any x-rays performed were independently reviewed by myself.   Differential diagnosis were considered with the presenting HPI.  Medications  lactated ringers bolus 500 mL (0 mLs Intravenous Stopped 12/26/21 2231)    Vitals:   12/26/21 2115 12/26/21 2130 12/26/21 2215 12/26/21 2230  BP: 137/80 (!) 146/92  137/83 (!) 132/97  Pulse: (!) 102 (!) 105 (!) 101 (!) 102  Resp: 17     Temp: 98 F (36.7 C)     TempSrc: Oral     SpO2: 98% 98% 95% 100%    Final diagnoses:  Influenza A    Admission/ observation were discussed with the admitting physician, patient  and/or family and they are comfortable with the plan.          Final Clinical Impression(s) / ED Diagnoses Final diagnoses:  Influenza A    Rx / DC Orders ED Discharge Orders     None         Corey Etienne, DO 12/26/21 2257

## 2021-12-26 NOTE — Assessment & Plan Note (Signed)
With recurrent fall will need PT /OT eval prior to dc

## 2021-12-26 NOTE — Assessment & Plan Note (Signed)
Dopplers ordered,   Plain imaging showing edema no free air  No evidence of infection

## 2021-12-26 NOTE — Assessment & Plan Note (Addendum)
Initiate tamiflu 75 mg po bid  Supportive care  CXR showing no infiltrates   No O2 requirement Given increased work of breathing and tachycardia will obtain CTA patient with unilateral edema

## 2021-12-26 NOTE — Assessment & Plan Note (Signed)
Replace and check mg/ ph replace as needed

## 2021-12-26 NOTE — Assessment & Plan Note (Signed)
Continue flomax at 0.4 mg po qday

## 2021-12-27 ENCOUNTER — Ambulatory Visit (HOSPITAL_COMMUNITY): Payer: Medicare Other

## 2021-12-27 ENCOUNTER — Observation Stay (HOSPITAL_COMMUNITY): Payer: Medicare Other

## 2021-12-27 ENCOUNTER — Encounter (HOSPITAL_COMMUNITY): Payer: Self-pay | Admitting: Internal Medicine

## 2021-12-27 DIAGNOSIS — Z993 Dependence on wheelchair: Secondary | ICD-10-CM | POA: Diagnosis not present

## 2021-12-27 DIAGNOSIS — R296 Repeated falls: Secondary | ICD-10-CM | POA: Diagnosis present

## 2021-12-27 DIAGNOSIS — Z8249 Family history of ischemic heart disease and other diseases of the circulatory system: Secondary | ICD-10-CM | POA: Diagnosis not present

## 2021-12-27 DIAGNOSIS — E860 Dehydration: Secondary | ICD-10-CM | POA: Diagnosis present

## 2021-12-27 DIAGNOSIS — I10 Essential (primary) hypertension: Secondary | ICD-10-CM | POA: Diagnosis not present

## 2021-12-27 DIAGNOSIS — R5381 Other malaise: Secondary | ICD-10-CM | POA: Diagnosis present

## 2021-12-27 DIAGNOSIS — Z801 Family history of malignant neoplasm of trachea, bronchus and lung: Secondary | ICD-10-CM | POA: Diagnosis not present

## 2021-12-27 DIAGNOSIS — M7989 Other specified soft tissue disorders: Secondary | ICD-10-CM

## 2021-12-27 DIAGNOSIS — R55 Syncope and collapse: Secondary | ICD-10-CM

## 2021-12-27 DIAGNOSIS — E876 Hypokalemia: Secondary | ICD-10-CM | POA: Diagnosis present

## 2021-12-27 DIAGNOSIS — N179 Acute kidney failure, unspecified: Secondary | ICD-10-CM | POA: Diagnosis not present

## 2021-12-27 DIAGNOSIS — N401 Enlarged prostate with lower urinary tract symptoms: Secondary | ICD-10-CM | POA: Diagnosis present

## 2021-12-27 DIAGNOSIS — S80212A Abrasion, left knee, initial encounter: Secondary | ICD-10-CM | POA: Diagnosis present

## 2021-12-27 DIAGNOSIS — R600 Localized edema: Secondary | ICD-10-CM | POA: Diagnosis present

## 2021-12-27 DIAGNOSIS — R531 Weakness: Secondary | ICD-10-CM | POA: Diagnosis present

## 2021-12-27 DIAGNOSIS — K56609 Unspecified intestinal obstruction, unspecified as to partial versus complete obstruction: Secondary | ICD-10-CM | POA: Diagnosis present

## 2021-12-27 DIAGNOSIS — Z23 Encounter for immunization: Secondary | ICD-10-CM | POA: Diagnosis present

## 2021-12-27 DIAGNOSIS — E440 Moderate protein-calorie malnutrition: Secondary | ICD-10-CM | POA: Diagnosis present

## 2021-12-27 DIAGNOSIS — Y92009 Unspecified place in unspecified non-institutional (private) residence as the place of occurrence of the external cause: Secondary | ICD-10-CM | POA: Diagnosis not present

## 2021-12-27 DIAGNOSIS — Z87891 Personal history of nicotine dependence: Secondary | ICD-10-CM | POA: Diagnosis not present

## 2021-12-27 DIAGNOSIS — R338 Other retention of urine: Secondary | ICD-10-CM | POA: Diagnosis present

## 2021-12-27 DIAGNOSIS — W1830XA Fall on same level, unspecified, initial encounter: Secondary | ICD-10-CM | POA: Diagnosis present

## 2021-12-27 DIAGNOSIS — F0390 Unspecified dementia without behavioral disturbance: Secondary | ICD-10-CM | POA: Diagnosis present

## 2021-12-27 DIAGNOSIS — M6282 Rhabdomyolysis: Secondary | ICD-10-CM | POA: Diagnosis present

## 2021-12-27 DIAGNOSIS — J101 Influenza due to other identified influenza virus with other respiratory manifestations: Secondary | ICD-10-CM | POA: Diagnosis not present

## 2021-12-27 DIAGNOSIS — Z1152 Encounter for screening for COVID-19: Secondary | ICD-10-CM | POA: Diagnosis not present

## 2021-12-27 DIAGNOSIS — E7800 Pure hypercholesterolemia, unspecified: Secondary | ICD-10-CM | POA: Diagnosis present

## 2021-12-27 LAB — PHOSPHORUS
Phosphorus: 3 mg/dL (ref 2.5–4.6)
Phosphorus: 2.9 mg/dL (ref 2.5–4.6)

## 2021-12-27 LAB — COMPREHENSIVE METABOLIC PANEL
ALT: 29 U/L (ref 0–44)
AST: 105 U/L — ABNORMAL HIGH (ref 15–41)
Albumin: 3.9 g/dL (ref 3.5–5.0)
Alkaline Phosphatase: 51 U/L (ref 38–126)
Anion gap: 9 (ref 5–15)
BUN: 18 mg/dL (ref 8–23)
CO2: 26 mmol/L (ref 22–32)
Calcium: 8.8 mg/dL — ABNORMAL LOW (ref 8.9–10.3)
Chloride: 100 mmol/L (ref 98–111)
Creatinine, Ser: 1.14 mg/dL (ref 0.61–1.24)
GFR, Estimated: >60 mL/min (ref >60)
Glucose, Bld: 102 mg/dL — ABNORMAL HIGH (ref 70–99)
Potassium: 3.8 mmol/L (ref 3.5–5.1)
Sodium: 135 mmol/L (ref 135–145)
Total Bilirubin: 1.4 mg/dL — ABNORMAL HIGH (ref 0.3–1.2)
Total Protein: 7.3 g/dL (ref 6.5–8.1)

## 2021-12-27 LAB — OSMOLALITY, URINE: Osmolality, Ur: 767 mOsm/kg (ref 300–900)

## 2021-12-27 LAB — TSH: TSH: 0.337 u[IU]/mL — ABNORMAL LOW (ref 0.350–4.500)

## 2021-12-27 LAB — PREALBUMIN: Prealbumin: 18 mg/dL (ref 18–38)

## 2021-12-27 LAB — ECHOCARDIOGRAM COMPLETE
AR max vel: 2.04 cm2
AV Area VTI: 2.12 cm2
AV Area mean vel: 2 cm2
AV Mean grad: 7 mmHg
AV Peak grad: 10.5 mmHg
Ao pk vel: 1.62 m/s
Area-P 1/2: 3.21 cm2
Calc EF: 55.4 %
MV M vel: 3.2 m/s
MV Peak grad: 40.9 mmHg
S' Lateral: 3 cm
Single Plane A2C EF: 54.3 %
Single Plane A4C EF: 55 %

## 2021-12-27 LAB — URINALYSIS, COMPLETE (UACMP) WITH MICROSCOPIC
Bacteria, UA: NONE SEEN
Bilirubin Urine: NEGATIVE
Glucose, UA: NEGATIVE mg/dL
Leukocytes,Ua: NEGATIVE
Nitrite: NEGATIVE
Protein, ur: NEGATIVE mg/dL
Specific Gravity, Urine: 1.025 (ref 1.005–1.030)
pH: 5.5 (ref 5.0–8.0)

## 2021-12-27 LAB — CBC
HCT: 47.3 % (ref 39.0–52.0)
Hemoglobin: 16.2 g/dL (ref 13.0–17.0)
MCH: 33 pg (ref 26.0–34.0)
MCHC: 34.2 g/dL (ref 30.0–36.0)
MCV: 96.3 fL (ref 80.0–100.0)
Platelets: 146 10*3/uL — ABNORMAL LOW (ref 150–400)
RBC: 4.91 MIL/uL (ref 4.22–5.81)
RDW: 13.6 % (ref 11.5–15.5)
WBC: 7.7 10*3/uL (ref 4.0–10.5)
nRBC: 0 % (ref 0.0–0.2)

## 2021-12-27 LAB — CREATININE, URINE, RANDOM: Creatinine, Urine: 176 mg/dL

## 2021-12-27 LAB — OSMOLALITY: Osmolality: 291 mOsm/kg (ref 275–295)

## 2021-12-27 LAB — SODIUM, URINE, RANDOM: Sodium, Ur: 98 mmol/L

## 2021-12-27 LAB — D-DIMER, QUANTITATIVE: D-Dimer, Quant: 5.92 ug/mL-FEU — ABNORMAL HIGH (ref 0.00–0.50)

## 2021-12-27 LAB — MAGNESIUM
Magnesium: 1.7 mg/dL (ref 1.7–2.4)
Magnesium: 1.7 mg/dL (ref 1.7–2.4)

## 2021-12-27 LAB — TROPONIN I (HIGH SENSITIVITY): Troponin I (High Sensitivity): 97 ng/L — ABNORMAL HIGH (ref <18)

## 2021-12-27 LAB — CK: Total CK: 3399 U/L — ABNORMAL HIGH (ref 49–397)

## 2021-12-27 LAB — LACTIC ACID, PLASMA: Lactic Acid, Venous: 1.4 mmol/L (ref 0.5–1.9)

## 2021-12-27 MED ORDER — SODIUM CHLORIDE 0.9 % IV SOLN: INTRAVENOUS | Status: DC

## 2021-12-27 MED ORDER — ACETAMINOPHEN 325 MG PO TABS
650.0000 mg | ORAL_TABLET | Freq: Four times a day (QID) | ORAL | Status: DC | PRN
  Administered 2021-12-28: 650 mg via ORAL
  Filled 2021-12-27: qty 2

## 2021-12-27 MED ORDER — GUAIFENESIN ER 600 MG PO TB12
600.0000 mg | ORAL_TABLET | Freq: Two times a day (BID) | ORAL | Status: DC
  Administered 2021-12-27 – 2021-12-31 (×9): 600 mg via ORAL
  Filled 2021-12-27 (×9): qty 1

## 2021-12-27 MED ORDER — INFLUENZA VAC A&B SA ADJ QUAD 0.5 ML IM PRSY
0.5000 mL | PREFILLED_SYRINGE | INTRAMUSCULAR | Status: AC
  Administered 2021-12-30: 0.5 mL via INTRAMUSCULAR
  Filled 2021-12-27: qty 0.5

## 2021-12-27 MED ORDER — IOHEXOL 350 MG/ML SOLN
75.0000 mL | Freq: Once | INTRAVENOUS | Status: AC | PRN
  Administered 2021-12-27: 75 mL via INTRAVENOUS

## 2021-12-27 MED ORDER — ONDANSETRON HCL 4 MG/2ML IJ SOLN: 4.0000 mg | Freq: Four times a day (QID) | INTRAMUSCULAR | Status: DC | PRN

## 2021-12-27 MED ORDER — SODIUM CHLORIDE 0.9 % IV SOLN: INTRAVENOUS | Status: AC

## 2021-12-27 MED ORDER — SODIUM CHLORIDE (PF) 0.9 % IJ SOLN
INTRAMUSCULAR | Status: AC
  Filled 2021-12-27: qty 50

## 2021-12-27 MED ORDER — ACETAMINOPHEN 650 MG RE SUPP: 650.0000 mg | Freq: Four times a day (QID) | RECTAL | Status: DC | PRN

## 2021-12-27 MED ORDER — TRAMADOL HCL 50 MG PO TABS: 50.0000 mg | ORAL_TABLET | Freq: Three times a day (TID) | ORAL | Status: DC | PRN

## 2021-12-27 MED ORDER — POTASSIUM CHLORIDE 10 MEQ/100ML IV SOLN
10.0000 meq | Freq: Once | INTRAVENOUS | Status: AC
  Administered 2021-12-27: 10 meq via INTRAVENOUS
  Filled 2021-12-27: qty 100

## 2021-12-27 MED ORDER — ENOXAPARIN SODIUM 30 MG/0.3ML IJ SOSY
30.0000 mg | PREFILLED_SYRINGE | INTRAMUSCULAR | Status: DC
  Administered 2021-12-27 – 2021-12-28 (×2): 30 mg via SUBCUTANEOUS
  Filled 2021-12-27 (×2): qty 0.3

## 2021-12-27 MED ORDER — ALBUTEROL SULFATE (2.5 MG/3ML) 0.083% IN NEBU
3.0000 mL | INHALATION_SOLUTION | RESPIRATORY_TRACT | Status: DC
  Administered 2021-12-27 (×2): 3 mL via RESPIRATORY_TRACT
  Filled 2021-12-27 (×3): qty 3

## 2021-12-27 MED ORDER — HYDROCODONE-ACETAMINOPHEN 5-325 MG PO TABS: 1.0000 | ORAL_TABLET | ORAL | Status: DC | PRN

## 2021-12-27 MED ORDER — METOPROLOL TARTRATE 12.5 MG HALF TABLET
12.5000 mg | ORAL_TABLET | Freq: Two times a day (BID) | ORAL | Status: DC
  Administered 2021-12-27 – 2021-12-31 (×8): 12.5 mg via ORAL
  Filled 2021-12-27 (×8): qty 1

## 2021-12-27 MED ORDER — ALBUTEROL SULFATE (2.5 MG/3ML) 0.083% IN NEBU
2.5000 mg | INHALATION_SOLUTION | RESPIRATORY_TRACT | Status: DC | PRN
  Administered 2021-12-27 – 2021-12-28 (×3): 2.5 mg via RESPIRATORY_TRACT
  Filled 2021-12-27 (×3): qty 3

## 2021-12-27 MED ORDER — ALBUTEROL SULFATE (2.5 MG/3ML) 0.083% IN NEBU
3.0000 mL | INHALATION_SOLUTION | Freq: Three times a day (TID) | RESPIRATORY_TRACT | Status: DC
  Administered 2021-12-28 – 2021-12-31 (×9): 3 mL via RESPIRATORY_TRACT
  Filled 2021-12-27 (×9): qty 3

## 2021-12-27 MED ORDER — TAMSULOSIN HCL 0.4 MG PO CAPS
0.4000 mg | ORAL_CAPSULE | Freq: Every day | ORAL | Status: DC
  Administered 2021-12-27 – 2021-12-30 (×3): 0.4 mg via ORAL
  Filled 2021-12-27 (×3): qty 1

## 2021-12-27 NOTE — Progress Notes (Signed)
  Echocardiogram 2D Echocardiogram has been performed.  Corey Kelley 12/27/2021, 1:06 PM

## 2021-12-27 NOTE — ED Notes (Signed)
Bladder scan x2 showed 662m and 6488m Primary RN notified.

## 2021-12-27 NOTE — Evaluation (Signed)
Physical Therapy Evaluation Patient Details Name: Corey Kelley MRN: [id number] DOB: [birth year] Today's Date: 12/27/2021  History of Present Illness  Patient is a 88 year old male who presented with coughing. Patient was admitted with AKI, rhabdomyolysis,and influenza A.PMH: dementia, HTN, BPH   Clinical Impression  Corey Kelley is 88 y.o. male admitted with above HPI and diagnosis. Patient is currently limited by functional impairments below (see PT problem list). Patient lives with his spouse and is independent with RW at baseline per chart, however pt is unreliable historian. Today pt was able to complete supine<>sit with good ability to initiate moving to EOB. He stood with Mod+2 assist to stabilize balance and prevent posterior lean. Pt took small steps at EOB but return to sit due to fatigue. Patient will benefit from continued skilled PT interventions to address impairments and progress independence with mobility, recommending ST rehab at [facility]. Acute PT will follow and progress as able.        Recommendations for follow up therapy are one component of a multi-disciplinary discharge planning process, led by the attending physician.  Recommendations may be updated based on patient status, additional functional criteria and insurance authorization.  Follow Up Recommendations Skilled nursing-short term rehab (<3 hours/day) Can patient physically be transported by private vehicle: No    Assistance Recommended at Discharge Frequent or constant Supervision/Assistance  Patient can return home with the following  A lot of help with walking and/or transfers;A lot of help with bathing/dressing/bathroom;Assistance with cooking/housework;Direct supervision/assist for medications management;Direct supervision/assist for financial management;Help with stairs or ramp for entrance;Assist for transportation    Equipment Recommendations None recommended by PT (TBD)  Recommendations for Other Services        Functional Status Assessment Patient has had a recent decline in their functional status and demonstrates the ability to make significant improvements in function in a reasonable and predictable amount of time.     Precautions / Restrictions Precautions Precautions: Fall Precaution Comments: monitor O2 and RR Restrictions Weight Bearing Restrictions: No      Mobility  Bed Mobility Overal bed mobility: Needs Assistance Bed Mobility: Supine to Sit, Sit to Supine     Supine to sit: Mod assist, HOB elevated Sit to supine: Max assist, +2 for safety/equipment   General bed mobility comments: Mod assist for supine>sit with cues to initiate and assist to pivot/raise trunk.    Transfers Overall transfer level: Needs assistance Equipment used: Rolling walker (2 wheels) Transfers: Sit to/from Stand Sit to Stand: Mod assist, +2 safety/equipment, +2 physical assistance           General transfer comment: Mod +2 assist with RW, cues for hand placement and blocking bil LE's to prevent feet from sliding anteriorly with stand as pt had slight posterior lean on rise. pt took small side steps towards HOB, mod assist to guide RW and to facilitate weight shift for stepping.    Ambulation/Gait                  Stairs            Wheelchair Mobility    Modified Rankin (Stroke Patients Only)       Balance Overall balance assessment: Needs assistance Sitting-balance support: Feet supported, Bilateral upper extremity supported Sitting balance-Leahy Scale: Fair Sitting balance - Comments: intermittently posteriorly leaning . Postural control: Posterior lean Standing balance support: During functional activity, Reliant on assistive device for balance, Bilateral upper extremity supported Standing balance-Leahy Scale: Poor  Pertinent Vitals/Pain Pain Assessment Pain Assessment: No/denies pain    Home Living Family/patient  expects to be discharged to:: Private residence Living Arrangements: Spouse/significant other Available Help at Discharge: Family;Available 24 hours/day Type of Home: House Home Access: Other (comment) (patient reported no but chart says 3 from past admission)         Home Equipment: Rolling Walker (2 wheels);Cane - single point      Prior Function Prior Level of Function : Independent/Modified Independent                     Hand Dominance        Extremity/Trunk Assessment   Upper Extremity Assessment Upper Extremity Assessment: Generalized weakness;Difficult to assess due to impaired cognition    Lower Extremity Assessment Lower Extremity Assessment: Defer to PT evaluation    Cervical / Trunk Assessment Cervical / Trunk Assessment: Kyphotic  Communication   Communication: HOH  Cognition Arousal/Alertness: Awake/alert Behavior During Therapy: Flat affect Overall Cognitive Status: No family/caregiver present to determine baseline cognitive functioning                                 General Comments: pleasant and follows cues/commands well. pt alert to self and place (hospital).        General Comments      Exercises     Assessment/Plan    PT Assessment Patient needs continued PT services  PT Problem List Decreased strength;Decreased activity tolerance;Decreased balance;Decreased mobility;Decreased cognition;Decreased knowledge of use of DME;Decreased safety awareness;Decreased knowledge of precautions;Obesity       PT Treatment Interventions DME instruction;Gait training;Stair training;Functional mobility training;Therapeutic activities;Therapeutic exercise;Balance training;Patient/family education    PT Goals (Current goals can be found in the Care Plan section)  Acute Rehab PT Goals Patient Stated Goal: none stated PT Goal Formulation: Patient unable to participate in goal setting Time For Goal Achievement: 01/10/22 Potential to  Achieve Goals: Fair    Frequency Min 3X/week     Co-evaluation               AM-PAC PT "6 Clicks" Mobility  Outcome Measure Help needed turning from your back to your side while in a flat bed without using bedrails?: A Little Help needed moving from lying on your back to sitting on the side of a flat bed without using bedrails?: A Lot Help needed moving to and from a bed to a chair (including a wheelchair)?: A Lot Help needed standing up from a chair using your arms (e.g., wheelchair or bedside chair)?: A Lot Help needed to walk in hospital room?: A Lot Help needed climbing 3-5 steps with a railing? : Total 6 Click Score: 12    End of Session Equipment Utilized During Treatment: Gait belt Activity Tolerance: Patient tolerated treatment well Patient left: in bed;with call bell/phone within reach Nurse Communication: Mobility status PT Visit Diagnosis: Muscle weakness (generalized) (M62.81);Difficulty in walking, not elsewhere classified (R26.2);Other abnormalities of gait and mobility (R26.89);History of falling (Z91.81)    Time: 7106-2694 PT Time Calculation (min) (ACUTE ONLY): 20 min   Charges:   PT Evaluation $PT Eval Moderate Complexity: 1 Mod        Verner Mould, DPT Acute Rehabilitation Services Office [phone]  12/27/21 1:06 PM

## 2021-12-27 NOTE — Assessment & Plan Note (Signed)
Plain imaging worrisome for possible small bowel obstruction will obtain CT of abdomen to further investigate

## 2021-12-27 NOTE — Evaluation (Signed)
SLP Cancellation Note  Patient Details Name: Carly Applegate MRN: [id number] DOB: [birth year]   Cancelled treatment:       Reason Eval/Treat Not Completed: Other (comment) (order for swallow eval received, pt in ED at this time, will continue efforts)  Kathleen Lime, MS [facility] Office [phone] Pager [phone]   Macario Golds 12/27/2021, 2:23 PM

## 2021-12-27 NOTE — Progress Notes (Signed)
RLE venous duplex has been completed.   Results can be found under chart review under CV PROC. 12/27/2021 9:51 AM Lestine Rahe RVT, RDMS

## 2021-12-27 NOTE — Assessment & Plan Note (Signed)
Bladder scan found to have more than 600 cc of fluid continue Flomax when able to tolerate p.o.'s 40 catheter ordered.  May need urology consult and/or follow-up

## 2021-12-27 NOTE — ED Notes (Signed)
Pt to CT

## 2021-12-27 NOTE — Progress Notes (Addendum)
[facility] PROGRESS NOTE    Progress Note  Corey Kelley  MAU:[id number] DOB: 03-Jul-1927 DOA: 12/26/2021 PCP: Lorene Dy, MD     Brief Narrative:   Corey Kelley is an 88 y.o. male past medical history significant for dementia, hypertension BPH comes in with falls started on the day of admission denies any prodromal symptoms except for coughing.,  Family relates he has had lower extremity edema right more than the left patient is unable to provide a history due to his dementia.  The family also relates that the wife has been sick    Assessment/Plan:   AKI (acute kidney injury) [facility]): Probably prerenal azotemia in the setting of ACE inhibitor and Norvasc. Antihypertensive medications were held. With a baseline creatinine of around 1 will start on IV fluid hydration his creatinine has returned to baseline.  Rhabdomyolysis: With a CK 4400 started on aggressive IV fluid hydration recheck in the morning  Influenza A: Probably contributing to poor oral intake. On Tamiflu p.o. for 5 days. Chest x-ray showed chronic changes, CT angio of the chest showed no PE some peribronchial wall thickening no other acute abnormalities.  Essential hypertension Hold all antihypertensive medication.  Hypokalemia: Replete orally now resolved.  Magnesium level is 1.7.  Right lower extremity edema: Film x-ray showed no acute findings lower extremity Dopplers pending. There is no signs of infection.  Acute urinary retention/BPH: Was found to have 600 cc on bladder scan was continue on Flomax. Foley catheter has been inserted. Will give him a voiding trial in 2 days.  Advance dementia: Currently on no medications.     DVT prophylaxis: lovenox Family Communication:none Status is: Observation The patient will require care spanning > 2 midnights and should be moved to inpatient because: Acute kidney injury and rhabdomyolysis    Code Status:     Code Status Orders  (From  admission, onward)           Start     Ordered   12/26/21 2310  Full code  Continuous       Question:  By:  Answer:  Consent: discussion documented in EHR   12/26/21 2310           Code Status History     This patient has a current code status but no historical code status.         IV Access:   Peripheral IV   Procedures and diagnostic studies:   CT Angio Chest Pulmonary Embolism (PE) W or WO Contrast  Result Date: 12/27/2021 CLINICAL DATA:  Increasing weakness and elevated D-dimer. Pulmonary embolism suspected. Bowel obstruction suspected. EXAM: CT ANGIOGRAPHY CHEST CT ABDOMEN AND PELVIS WITH CONTRAST TECHNIQUE: Multidetector CT imaging of the chest was performed using the standard protocol during bolus administration of intravenous contrast. Multiplanar CT image reconstructions and MIPs were obtained to evaluate the vascular anatomy. Multidetector CT imaging of the abdomen and pelvis was performed using the standard protocol during bolus administration of intravenous contrast. RADIATION DOSE REDUCTION: This exam was performed according to the departmental dose-optimization program which includes automated exposure control, adjustment of the mA and/or kV according to patient size and/or use of iterative reconstruction technique. CONTRAST:  42m OMNIPAQUE IOHEXOL 350 MG/ML SOLN COMPARISON:  Radiographs 12/26/2021 and MRI abdomen 11/07/2021 and CT abdomen and pelvis 01/26/2020 FINDINGS: CTA CHEST FINDINGS Cardiovascular: Satisfactory opacification of the central pulmonary arteries. Poor opacification of the segmental pulmonary arteries. No central pulmonary embolism. Mild cardiomegaly. No pericardial effusion. Coronary artery and aortic atherosclerotic calcification. Mediastinum/Nodes:  No thoracic adenopathy by size. Unremarkable esophagus. Lungs/Pleura: Expiratory phase exam. Diffuse mild bronchial wall thickening. Scattered atelectasis/scarring in the lung bases. No pleural  effusion or pneumothorax. Musculoskeletal: No chest wall abnormality. Irregular area of sclerosis in the superior anterior T8 vertebral body likely a benign bone island. No destructive change. Review of the MIP images confirms the above findings. CT ABDOMEN and PELVIS FINDINGS Hepatobiliary: No destructive change. Unchanged benign cysts or hemangiomas in the liver. Unremarkable gallbladder. No biliary dilation. Pancreas: Unremarkable. Spleen: Unremarkable. Adrenals/Urinary Tract: Normal adrenal glands. Complex cystic lesions in the lower pole of the right kidney and mid posterior left kidney where previously evaluated with MRI on 11/07/2021. See that report for recommendations. Urinary calculi or hydronephrosis. Foley catheter and gas within the decompressed bladder. Stomach/Bowel: Large stool ball in the rectum. Normal caliber large and small bowel. No bowel wall thickening. Unremarkable stomach. Vascular/Lymphatic: Aortic atherosclerosis. Similar ectasia of the infrarenal abdominal aorta measuring 2.9 cm. No enlarged abdominal or pelvic lymph nodes. Reproductive: Enlarged prostate. Other: No free intraperitoneal fluid or air. Small fat containing bilateral inguinal hernias. Musculoskeletal: Similar sclerosis in the right superior pubic ramus. No acute osseous abnormality. Advanced degenerative change in the lower lumbar spine. Review of the MIP images confirms the above findings. IMPRESSION: 1. No central pulmonary embolism. The segmental arteries are not well opacified. 2. Mild bronchial wall thickening. 3. Coronary artery and Aortic Atherosclerosis (ICD10-I70.0). 1. No acute abnormality in the abdomen or pelvis. 2. Complex cystic lesions in the lower pole of the right kidney and mid posterior left kidney where previously evaluated with MRI on 11/07/2021. See that report for recommendations. Electronically Signed   By: Placido Sou M.D.   On: 12/27/2021 02:34   CT ABDOMEN PELVIS W CONTRAST  Result Date:  12/27/2021 CLINICAL DATA:  Increasing weakness and elevated D-dimer. Pulmonary embolism suspected. Bowel obstruction suspected. EXAM: CT ANGIOGRAPHY CHEST CT ABDOMEN AND PELVIS WITH CONTRAST TECHNIQUE: Multidetector CT imaging of the chest was performed using the standard protocol during bolus administration of intravenous contrast. Multiplanar CT image reconstructions and MIPs were obtained to evaluate the vascular anatomy. Multidetector CT imaging of the abdomen and pelvis was performed using the standard protocol during bolus administration of intravenous contrast. RADIATION DOSE REDUCTION: This exam was performed according to the departmental dose-optimization program which includes automated exposure control, adjustment of the mA and/or kV according to patient size and/or use of iterative reconstruction technique. CONTRAST:  85m OMNIPAQUE IOHEXOL 350 MG/ML SOLN COMPARISON:  Radiographs 12/26/2021 and MRI abdomen 11/07/2021 and CT abdomen and pelvis 01/26/2020 FINDINGS: CTA CHEST FINDINGS Cardiovascular: Satisfactory opacification of the central pulmonary arteries. Poor opacification of the segmental pulmonary arteries. No central pulmonary embolism. Mild cardiomegaly. No pericardial effusion. Coronary artery and aortic atherosclerotic calcification. Mediastinum/Nodes: No thoracic adenopathy by size. Unremarkable esophagus. Lungs/Pleura: Expiratory phase exam. Diffuse mild bronchial wall thickening. Scattered atelectasis/scarring in the lung bases. No pleural effusion or pneumothorax. Musculoskeletal: No chest wall abnormality. Irregular area of sclerosis in the superior anterior T8 vertebral body likely a benign bone island. No destructive change. Review of the MIP images confirms the above findings. CT ABDOMEN and PELVIS FINDINGS Hepatobiliary: No destructive change. Unchanged benign cysts or hemangiomas in the liver. Unremarkable gallbladder. No biliary dilation. Pancreas: Unremarkable. Spleen:  Unremarkable. Adrenals/Urinary Tract: Normal adrenal glands. Complex cystic lesions in the lower pole of the right kidney and mid posterior left kidney where previously evaluated with MRI on 11/07/2021. See that report for recommendations. Urinary calculi or hydronephrosis. Foley catheter and  gas within the decompressed bladder. Stomach/Bowel: Large stool ball in the rectum. Normal caliber large and small bowel. No bowel wall thickening. Unremarkable stomach. Vascular/Lymphatic: Aortic atherosclerosis. Similar ectasia of the infrarenal abdominal aorta measuring 2.9 cm. No enlarged abdominal or pelvic lymph nodes. Reproductive: Enlarged prostate. Other: No free intraperitoneal fluid or air. Small fat containing bilateral inguinal hernias. Musculoskeletal: Similar sclerosis in the right superior pubic ramus. No acute osseous abnormality. Advanced degenerative change in the lower lumbar spine. Review of the MIP images confirms the above findings. IMPRESSION: 1. No central pulmonary embolism. The segmental arteries are not well opacified. 2. Mild bronchial wall thickening. 3. Coronary artery and Aortic Atherosclerosis (ICD10-I70.0). 1. No acute abnormality in the abdomen or pelvis. 2. Complex cystic lesions in the lower pole of the right kidney and mid posterior left kidney where previously evaluated with MRI on 11/07/2021. See that report for recommendations. Electronically Signed   By: Placido Sou M.D.   On: 12/27/2021 02:34   DG Abd 1 View  Result Date: 12/26/2021 CLINICAL DATA:  Weakness.  Constipation EXAM: ABDOMEN - 1 VIEW COMPARISON:  CT abdomen and pelvis 01/26/2020 FINDINGS: Prominent gas-filled loops of small and large bowel without definite dilation. Exam is limited by overlapping structures in the pelvis. No definite acute osseous abnormality. IMPRESSION: Prominent gas-filled loops of small and large bowel. If there is concern for obstruction CT is recommended for further evaluation. Electronically  Signed   By: Placido Sou M.D.   On: 12/26/2021 23:50   CT Head Wo Contrast  Result Date: 12/26/2021 CLINICAL DATA:  Recent falls EXAM: CT HEAD WITHOUT CONTRAST TECHNIQUE: Contiguous axial images were obtained from the base of the skull through the vertex without intravenous contrast. RADIATION DOSE REDUCTION: This exam was performed according to the departmental dose-optimization program which includes automated exposure control, adjustment of the mA and/or kV according to patient size and/or use of iterative reconstruction technique. COMPARISON:  01/26/2020 FINDINGS: Brain: No evidence of acute infarction, hemorrhage, hydrocephalus, extra-axial collection or mass lesion/mass effect. Chronic atrophic and ischemic changes are noted. Vascular: No hyperdense vessel or unexpected calcification. Skull: Normal. Negative for fracture or focal lesion. Sinuses/Orbits: No acute finding. Other: None. IMPRESSION: Chronic atrophic and ischemic changes without acute abnormality. Electronically Signed   By: Inez Catalina M.D.   On: 12/26/2021 22:09   DG Tibia/Fibula Right  Result Date: 12/26/2021 CLINICAL DATA:  Fall and trauma to the right lower extremity. EXAM: RIGHT TIBIA AND FIBULA - 2 VIEW; RIGHT FEMUR 2 VIEWS; RIGHT FOOT COMPLETE - 3+ VIEW COMPARISON:  Right hip radiograph dated 01/26/2020. FINDINGS: Evaluation is limited due to body habitus and soft tissue attenuation. There is no acute fracture or dislocation. There is moderate arthritic changes of the right knee. There is diffuse subcutaneous edema and soft tissue swelling of the dorsum of the foot. No radiopaque foreign object or soft tissue gas. IMPRESSION: 1. No acute fracture or dislocation. 2. Moderate arthritic changes of the right knee. 3. Diffuse subcutaneous edema and soft tissue swelling of the dorsum of the foot. Electronically Signed   By: Anner Crete M.D.   On: 12/26/2021 22:07   DG Foot Complete Right  Result Date: 12/26/2021 CLINICAL  DATA:  Fall and trauma to the right lower extremity. EXAM: RIGHT TIBIA AND FIBULA - 2 VIEW; RIGHT FEMUR 2 VIEWS; RIGHT FOOT COMPLETE - 3+ VIEW COMPARISON:  Right hip radiograph dated 01/26/2020. FINDINGS: Evaluation is limited due to body habitus and soft tissue attenuation. There is no acute  fracture or dislocation. There is moderate arthritic changes of the right knee. There is diffuse subcutaneous edema and soft tissue swelling of the dorsum of the foot. No radiopaque foreign object or soft tissue gas. IMPRESSION: 1. No acute fracture or dislocation. 2. Moderate arthritic changes of the right knee. 3. Diffuse subcutaneous edema and soft tissue swelling of the dorsum of the foot. Electronically Signed   By: Anner Crete M.D.   On: 12/26/2021 22:07   DG Femur Min 2 Views Right  Result Date: 12/26/2021 CLINICAL DATA:  Fall and trauma to the right lower extremity. EXAM: RIGHT TIBIA AND FIBULA - 2 VIEW; RIGHT FEMUR 2 VIEWS; RIGHT FOOT COMPLETE - 3+ VIEW COMPARISON:  Right hip radiograph dated 01/26/2020. FINDINGS: Evaluation is limited due to body habitus and soft tissue attenuation. There is no acute fracture or dislocation. There is moderate arthritic changes of the right knee. There is diffuse subcutaneous edema and soft tissue swelling of the dorsum of the foot. No radiopaque foreign object or soft tissue gas. IMPRESSION: 1. No acute fracture or dislocation. 2. Moderate arthritic changes of the right knee. 3. Diffuse subcutaneous edema and soft tissue swelling of the dorsum of the foot. Electronically Signed   By: Anner Crete M.D.   On: 12/26/2021 22:07   DG Chest Port 1 View  Result Date: 12/26/2021 CLINICAL DATA:  Weakness. Questionable sepsis evaluate for abnormality EXAM: PORTABLE CHEST 1 VIEW COMPARISON:  08/11/2013 FINDINGS: Low lung volumes. Stable cardiomediastinal silhouette. Aortic atherosclerotic calcification. Bibasilar atelectasis. Unchanged chronic bronchitic thickening. No  displaced rib fractures. IMPRESSION: Unchanged chronic bronchitic thickening. Electronically Signed   By: Placido Sou M.D.   On: 12/26/2021 21:01     Medical Consultants:   None.   Subjective:    Isaid Salvia has no new complaints  Objective:    Vitals:   12/27/21 0345 12/27/21 0401 12/27/21 0430 12/27/21 0500  BP: (!) 150/88 (!) 157/92  (!) 155/97  Pulse: (!) 112 (!) 126 (!) 116 (!) 113  Resp: 20 20 (!) 34 (!) 23  Temp:  (!) 97.5 F (36.4 C)    TempSrc:  Oral    SpO2: 93% 97% 97% 99%   SpO2: 99 %   Intake/Output Summary (Last 24 hours) at 12/27/2021 2595 Last data filed at 12/27/2021 0518 Gross per 24 hour  Intake 100.2 ml  Output 1150 ml  Net -1049.8 ml   There were no vitals filed for this visit.  Exam: General exam: In no acute distress. Respiratory system: Good air movement and rhonchi bilaterally Cardiovascular system: S1 & S2 heard, RRR. No JVD. Gastrointestinal system: Abdomen is nondistended, soft and nontender.  Extremities: No pedal edema. Skin: No rashes, lesions or ulcers Psychiatry: No judgment or insight of medical condition.   Data Reviewed:    Labs: Basic Metabolic Panel: Recent Labs  Lab 12/26/21 0049 12/26/21 2102 12/27/21 0510  NA  --  134* 135  K  --  3.4* 3.8  CL  --  101 100  CO2  --  25 26  GLUCOSE  --  112* 102*  BUN  --  21 18  CREATININE  --  1.35* 1.14  CALCIUM  --  9.1 8.8*  MG 1.7  --  1.7  PHOS 3.0  --  2.9   GFR CrCl cannot be calculated (Unknown ideal weight.). Liver Function Tests: Recent Labs  Lab 12/26/21 2102 12/27/21 0510  AST 92* 105*  ALT 25 29  ALKPHOS 47 51  BILITOT 1.1  1.4*  PROT 6.8 7.3  ALBUMIN 3.7 3.9   No results for input(s): "LIPASE", "AMYLASE" in the last 168 hours. No results for input(s): "AMMONIA" in the last 168 hours. Coagulation profile Recent Labs  Lab 12/26/21 2102  INR 1.1   COVID-19 Labs  Recent Labs    12/26/21 0049  DDIMER 5.92*    Lab Results   Component Value Date   SARSCOV2NAA NEGATIVE 12/26/2021   [place] NEGATIVE 01/26/2020    CBC: Recent Labs  Lab 12/26/21 2102 12/27/21 0510  WBC 7.9 7.7  NEUTROABS 6.5  --   HGB 16.2 16.2  HCT 47.3 47.3  MCV 96.7 96.3  PLT 163 146*   Cardiac Enzymes: Recent Labs  Lab 12/26/21 0049  CKTOTAL 3,399*   BNP (last 3 results) No results for input(s): "PROBNP" in the last 8760 hours. CBG: No results for input(s): "GLUCAP" in the last 168 hours. D-Dimer: Recent Labs    12/26/21 0049  DDIMER 5.92*   Hgb A1c: No results for input(s): "HGBA1C" in the last 72 hours. Lipid Profile: No results for input(s): "CHOL", "HDL", "LDLCALC", "TRIG", "CHOLHDL", "LDLDIRECT" in the last 72 hours. Thyroid function studies: Recent Labs    12/27/21 0049  TSH 0.337*   Anemia work up: No results for input(s): "VITAMINB12", "FOLATE", "FERRITIN", "TIBC", "IRON", "RETICCTPCT" in the last 72 hours. Sepsis Labs: Recent Labs  Lab 12/26/21 2102 12/27/21 0049 12/27/21 0510  WBC 7.9  --  7.7  LATICACIDVEN 1.8 1.4  --    Microbiology Recent Results (from the past 240 hour(s))  Resp panel by RT-PCR (RSV, Flu A&B, Covid) Anterior Nasal Swab     Status: Abnormal   Collection Time: 12/26/21  9:09 PM   Specimen: Anterior Nasal Swab  Result Value Ref Range Status   SARS Coronavirus 2 by RT PCR NEGATIVE NEGATIVE Final    Comment: (NOTE) SARS-CoV-2 target nucleic acids are NOT DETECTED.  The SARS-CoV-2 RNA is generally detectable in upper respiratory specimens during the acute phase of infection. The lowest concentration of SARS-CoV-2 viral copies this assay can detect is 138 copies/mL. A negative result does not preclude SARS-Cov-2 infection and should not be used as the sole basis for treatment or other patient management decisions. A negative result may occur with  improper specimen collection/handling, submission of specimen other than nasopharyngeal swab, presence of viral mutation(s)  within the areas targeted by this assay, and inadequate number of viral copies(<138 copies/mL). A negative result must be combined with clinical observations, patient history, and epidemiological information. The expected result is Negative.  Fact Sheet for Patients:  EntrepreneurPulse.com.au  Fact Sheet for Healthcare Providers:  IncredibleEmployment.be  This test is no t yet approved or cleared by the [place] FDA and  has been authorized for detection and/or diagnosis of SARS-CoV-2 by FDA under an Emergency Use Authorization (EUA). This EUA will remain  in effect (meaning this test can be used) for the duration of the COVID-19 declaration under Section 564(b)(1) of the Act, 21 U.S.C.section 360bbb-3(b)(1), unless the authorization is terminated  or revoked sooner.       Influenza A by PCR POSITIVE (A) NEGATIVE Final   Influenza B by PCR NEGATIVE NEGATIVE Final    Comment: (NOTE) The Xpert Xpress SARS-CoV-2/FLU/RSV plus assay is intended as an aid in the diagnosis of influenza from Nasopharyngeal swab specimens and should not be used as a sole basis for treatment. Nasal washings and aspirates are unacceptable for Xpert Xpress SARS-CoV-2/FLU/RSV testing.  Fact Sheet for Patients:  EntrepreneurPulse.com.au  Fact Sheet for Healthcare Providers: IncredibleEmployment.be  This test is not yet approved or cleared by the [place] FDA and has been authorized for detection and/or diagnosis of SARS-CoV-2 by FDA under an Emergency Use Authorization (EUA). This EUA will remain in effect (meaning this test can be used) for the duration of the COVID-19 declaration under Section 564(b)(1) of the Act, 21 U.S.C. section 360bbb-3(b)(1), unless the authorization is terminated or revoked.     Resp Syncytial Virus by PCR NEGATIVE NEGATIVE Final    Comment: (NOTE) Fact Sheet for  Patients: EntrepreneurPulse.com.au  Fact Sheet for Healthcare Providers: IncredibleEmployment.be  This test is not yet approved or cleared by the [place] FDA and has been authorized for detection and/or diagnosis of SARS-CoV-2 by FDA under an Emergency Use Authorization (EUA). This EUA will remain in effect (meaning this test can be used) for the duration of the COVID-19 declaration under Section 564(b)(1) of the Act, 21 U.S.C. section 360bbb-3(b)(1), unless the authorization is terminated or revoked.  Performed at [facility], [place] [address] [place], [place] [address]      Medications:    enoxaparin (LOVENOX) injection  30 mg Subcutaneous Q24H   guaiFENesin  600 mg Oral BID   oseltamivir  30 mg Oral BID   tamsulosin  0.4 mg Oral QPC supper   Continuous Infusions:  sodium chloride 100 mL/hr at 12/27/21 0251      LOS: 0 days   Charlynne Cousins  [facility]  12/27/2021, 6:32 AM

## 2021-12-27 NOTE — Evaluation (Signed)
Occupational Therapy Evaluation Patient Details Name: Corey Kelley MRN: [id number] DOB: August 02, 1927 Today's Date: 12/27/2021   History of Present Illness Patient is a 88 year old male who presented with coughing. Patient was admitted with AKI, rhabdomyolysis,and influenza A.PMH: dementia, HTN, BPH   Clinical Impression   Patient is a 88 year old male who was admitted for above. Patient reported living at home with wife. Patient was noted to have some confusion during session impacting PLOF. No family available in room. Patient was noted to have decreased functional activity tolernace, decreased ROM, decreased BUE strength, decreased endurance, decreased sitting balance, decreased standing balanced, decreased safety awareness, and decreased knowledge of AE/AD impacting participation in ADLs. Patient would continue to benefit from skilled OT services at this time while admitted and after d/c to address noted deficits in order to improve overall safety and independence in ADLs.        Recommendations for follow up therapy are one component of a multi-disciplinary discharge planning process, led by the attending physician.  Recommendations may be updated based on patient status, additional functional criteria and insurance authorization.   Follow Up Recommendations  Skilled nursing-short term rehab (<3 hours/day)     Assistance Recommended at Discharge Frequent or constant Supervision/Assistance  Patient can return home with the following Assistance with cooking/housework;Assist for transportation;Direct supervision/assist for medications management;Help with stairs or ramp for entrance;Direct supervision/assist for financial management;A lot of help with walking and/or transfers;A lot of help with bathing/dressing/bathroom    Functional Status Assessment  Patient has had a recent decline in their functional status and demonstrates the ability to make significant improvements in function in a  reasonable and predictable amount of time.  Equipment Recommendations       Recommendations for Other Services       Precautions / Restrictions Precautions Precautions: Fall Precaution Comments: monitor O2 and RR Restrictions Weight Bearing Restrictions: No      Mobility Bed Mobility Overal bed mobility: Needs Assistance Bed Mobility: Supine to Sit     Supine to sit: Max assist     General bed mobility comments: with increased A to advance BLE to EOB and back into bed.        Balance Overall balance assessment: Mild deficits observed, not formally tested               ADL either performed or assessed with clinical judgement   ADL Overall ADL's : Needs assistance/impaired Eating/Feeding: Set up   Grooming: Set up;Sitting   Upper Body Bathing: Minimal assistance;Sitting   Lower Body Bathing: Total assistance;Bed level   Upper Body Dressing : Bed level;Minimal assistance   Lower Body Dressing: Bed level;Total assistance     Toilet Transfer Details (indicate cue type and reason): patient was noted to have lateral lean to R side sitting EOB with increased weakness with attempted standing. returned to [place] in Ed after breif standing with HHA Toileting- Clothing Manipulation and Hygiene: Bed level;Total assistance               Vision   Vision Assessment?: No apparent visual deficits            Pertinent Vitals/Pain Pain Assessment Pain Assessment: No/denies pain        Extremity/Trunk Assessment Upper Extremity Assessment Upper Extremity Assessment: Generalized weakness;Difficult to assess due to impaired cognition   Lower Extremity Assessment Lower Extremity Assessment: Defer to PT evaluation   Cervical / Trunk Assessment Cervical / Trunk Assessment: Kyphotic   Communication Communication Communication:  HOH   Cognition Arousal/Alertness: Awake/alert Behavior During Therapy: Flat affect Overall Cognitive Status: No  family/caregiver present to determine baseline cognitive functioning                      Home Living Family/patient expects to be discharged to:: Private residence Living Arrangements: Spouse/significant other Available Help at Discharge: Family;Available 24 hours/day Type of Home: House Home Access: Other (comment) (patient reported no but chart says 3 from past admission)                     Home Equipment: Rolling Walker (2 wheels);Cane - single point          Prior Functioning/Environment Prior Level of Function : Independent/Modified Independent                  OT Problem List: Decreased strength;Decreased activity tolerance;Impaired balance (sitting and/or standing);Decreased coordination;Decreased safety awareness;Decreased knowledge of use of DME or AE;Cardiopulmonary status limiting activity;Decreased knowledge of precautions;Impaired UE functional use      OT Treatment/Interventions: Self-care/ADL training;Therapeutic exercise;Therapeutic activities;Energy conservation;DME and/or AE instruction;Patient/family education    OT Goals(Current goals can be found in the care plan section) Acute Rehab OT Goals Patient Stated Goal: none stated OT Goal Formulation: Patient unable to participate in goal setting Time For Goal Achievement: 01/10/22 Potential to Achieve Goals: Fair  OT Frequency: Min 2X/week       AM-PAC OT "6 Clicks" Daily Activity     Outcome Measure Help from another person eating meals?: A Little Help from another person taking care of personal grooming?: A Little Help from another person toileting, which includes using toliet, bedpan, or urinal?: A Lot Help from another person bathing (including washing, rinsing, drying)?: A Lot Help from another person to put on and taking off regular upper body clothing?: A Lot Help from another person to put on and taking off regular lower body clothing?: A Lot 6 Click Score: 14   End of Session  Equipment Utilized During Treatment: Gait belt  Activity Tolerance: Patient limited by fatigue;Patient limited by lethargy Patient left: Other (comment);with call bell/phone within reach (stretcher in ED)  OT Visit Diagnosis: Unsteadiness on feet (R26.81);Other abnormalities of gait and mobility (R26.89)                Time: 1100-1113 OT Time Calculation (min): 13 min Charges:  OT General Charges $OT Visit: 1 Visit OT Evaluation $OT Eval Moderate Complexity: 1 Mod  Gayathri Futrell OTR/L, MS Acute Rehabilitation Department Office# [phone]   Willa Rough 12/27/2021, 12:35 PM

## 2021-12-27 NOTE — ED Notes (Signed)
RN placed pt on 2lpm [place].

## 2021-12-28 ENCOUNTER — Inpatient Hospital Stay (HOSPITAL_COMMUNITY): Payer: Medicare Other

## 2021-12-28 DIAGNOSIS — J101 Influenza due to other identified influenza virus with other respiratory manifestations: Secondary | ICD-10-CM | POA: Diagnosis not present

## 2021-12-28 DIAGNOSIS — R338 Other retention of urine: Secondary | ICD-10-CM | POA: Diagnosis not present

## 2021-12-28 DIAGNOSIS — I10 Essential (primary) hypertension: Secondary | ICD-10-CM | POA: Diagnosis not present

## 2021-12-28 DIAGNOSIS — N179 Acute kidney failure, unspecified: Secondary | ICD-10-CM | POA: Diagnosis not present

## 2021-12-28 LAB — BASIC METABOLIC PANEL
Anion gap: 7 (ref 5–15)
BUN: 18 mg/dL (ref 8–23)
CO2: 24 mmol/L (ref 22–32)
Calcium: 8.2 mg/dL — ABNORMAL LOW (ref 8.9–10.3)
Chloride: 102 mmol/L (ref 98–111)
Creatinine, Ser: 1.02 mg/dL (ref 0.61–1.24)
GFR, Estimated: >60 mL/min (ref >60)
Glucose, Bld: 101 mg/dL — ABNORMAL HIGH (ref 70–99)
Potassium: 3.5 mmol/L (ref 3.5–5.1)
Sodium: 133 mmol/L — ABNORMAL LOW (ref 135–145)

## 2021-12-28 LAB — URINE CULTURE

## 2021-12-28 LAB — CK: Total CK: 1607 U/L — ABNORMAL HIGH (ref 49–397)

## 2021-12-28 MED ORDER — ENOXAPARIN SODIUM 40 MG/0.4ML IJ SOSY
40.0000 mg | PREFILLED_SYRINGE | INTRAMUSCULAR | Status: DC
  Administered 2021-12-29: 40 mg via SUBCUTANEOUS
  Filled 2021-12-28: qty 0.4

## 2021-12-28 MED ORDER — SODIUM CHLORIDE 0.9 % IV SOLN: INTRAVENOUS | Status: DC

## 2021-12-28 MED ORDER — POTASSIUM CHLORIDE CRYS ER 20 MEQ PO TBCR
40.0000 meq | EXTENDED_RELEASE_TABLET | Freq: Two times a day (BID) | ORAL | Status: AC
  Administered 2021-12-28: 40 meq via ORAL
  Filled 2021-12-28: qty 2

## 2021-12-28 MED ORDER — CHLORHEXIDINE GLUCONATE CLOTH 2 % EX PADS
6.0000 | MEDICATED_PAD | Freq: Every day | CUTANEOUS | Status: DC
  Administered 2021-12-28 – 2021-12-31 (×4): 6 via TOPICAL

## 2021-12-28 MED ORDER — SODIUM CHLORIDE 0.9 % IV SOLN: INTRAVENOUS | Status: AC

## 2021-12-28 NOTE — Progress Notes (Signed)
Chaplain engaged in visit with Corey Kelley's nurse.  Nurse let Chaplain know that Corey Kelley has dementia, as well as his wife.  Chaplain cannot complete any Advanced Directive, Healthcare POA documents because of dementia/capacity.  Chaplain will call daughter and let her know.  Because daughter is next-of-kin, she does have decision making abilities medically due to her mom's dementia.    Chaplain will follow-up with family.     12/28/21 1000  Clinical Encounter Type  Visited With Health care provider  Visit Type Initial;Social support

## 2021-12-28 NOTE — Progress Notes (Signed)
   12/27/21 2351  Assess: MEWS Score  Temp (!) 101.8 F (38.8 C)  BP 118/69  MAP (mmHg) 82  Pulse Rate 87  Resp 18  SpO2 100 %  O2 Device Room Air  Assess: MEWS Score  MEWS Temp 2  MEWS Systolic 0  MEWS Pulse 0  MEWS RR 0  MEWS LOC 0  MEWS Score 2  MEWS Score Color Yellow  Assess: if the MEWS score is Yellow or Red  Were vital signs taken at a resting state? Yes  Focused Assessment No change from prior assessment  Does the patient meet 2 or more of the SIRS criteria? No  MEWS guidelines implemented *See Row Information* Yes  Treat  MEWS Interventions Administered prn meds/treatments  Pain Scale 0-10  Pain Score 0  Take Vital Signs  Increase Vital Sign Frequency  Yellow: Q 2hr X 2 then Q 4hr X 2, if remains yellow, continue Q 4hrs  Escalate  MEWS: Escalate Yellow: discuss with charge nurse/RN and consider discussing with provider and RRT  Notify: Charge Nurse/RN  Name of Charge Nurse/RN Notified Bishnu, RN  Date Charge Nurse/RN Notified 12/28/21  Time Charge Nurse/RN Notified 0000  Provider Notification  Provider Name/Title Raenette Rover, NP  Date Provider Notified 12/28/21  Time Provider Notified 0001  Method of Notification Page  Notification Reason Change in status  Assess: SIRS CRITERIA  SIRS Temperature  1  SIRS Pulse 0  SIRS Respirations  0  SIRS WBC 0  SIRS Score Sum  1   Tylenol administered as ordered

## 2021-12-28 NOTE — Evaluation (Signed)
Clinical/Bedside Swallow Evaluation Patient Details  Name: Corey Kelley MRN: [id number] Date of Birth: 07/04/1927  Today's Date: 12/28/2021 Time: SLP Start Time (ACUTE ONLY): 1015 SLP Stop Time (ACUTE ONLY): 1035 SLP Time Calculation (min) (ACUTE ONLY): 20 min  Past Medical History:  Past Medical History:  Diagnosis Date   Hypercholesteremia    Hypertension    Prostate nodule    Past Surgical History:  Past Surgical History:  Procedure Laterality Date   HERNIA REPAIR  1950's   HPI:  Corey Kelley is a 88 y.o. male with medical history significant of Dementia, HTN, HLD, BPH        Presented with  falls, fatigue  Pt have had 3 falls today had to call EMS to get him up from the ground  Not sure if he hit his head  BP 146/72  Denies CP , SOB or fever but has been coughing    Has a right leg swelling    Family feels that both legs have been swollen for months but right more than left   It has been interfering with his walking   Pt unable to provide hx   But no fever no diarrhea   He used to smoke but no any more does not drink  His wife has been sick recently as well; 12/28 CXR indicated The appearance the chest suggests progressive worsening severe  bronchitis, as above.  Nursing informed SLP pt "coughing with liquids and pocketing food" and requiring breathing tx from RT.  BSE generated.    Assessment / Plan / Recommendation  Clinical Impression  Pt seen for clinical swallowing evaluation with wet, congested vocal quality noted prior to PO intake with SLP.  Pt and wife present for evaluation with breakfast tray consumed prior to BSE completion.  Oral care recently completed and pt kindly declined.  Pt exhibited immediate cough/delay in the initiation of the swallow with thin, ice chips and nectar-thickened liquids via tsp.  Honey-thickened liquids and puree elicited a delayed cough/multiple swallows with pt being unable to expectorate material into oral cavity d/t weak cough.  Generalized  weakness observed during OME.  No solids attempted this date d/t risk for aspiration and dyspnea/fatigue noted during evaluation.  Recommend NPO status with objective assessment to be completed prior to initiating an oral diet.  Thank you for this consult. SLP Visit Diagnosis: Dysphagia, oropharyngeal phase (R13.12)    Aspiration Risk  Moderate aspiration risk    Diet Recommendation   NPO  Medication Administration: Via alternative means    Other  Recommendations Oral Care Recommendations: Oral care QID;Staff/trained caregiver to provide oral care    Recommendations for follow up therapy are one component of a multi-disciplinary discharge planning process, led by the attending physician.  Recommendations may be updated based on patient status, additional functional criteria and insurance authorization.  Follow up Recommendations Follow physician's recommendations for discharge plan and follow up therapies      Assistance Recommended at Discharge    Functional Status Assessment Patient has had a recent decline in their functional status and demonstrates the ability to make significant improvements in function in a reasonable and predictable amount of time.  Frequency and Duration min 2x/week  1 week       Prognosis Prognosis for Safe Diet Advancement: Fair Barriers to Reach Goals: Cognitive deficits      Swallow Study   General Date of Onset: 12/26/21 HPI: Corey Kelley is a 88 y.o. male with medical history significant of Dementia, HTN,  HLD, BPH        Presented with  falls, fatigue  Pt have had 3 falls today had to call EMS to get him up from the ground  Not sure if he hit his head  BP 146/72  Denies CP , SOB or fever but has been coughing    Has a right leg swelling    Family feels that both legs have been swollen for months but right more than left   It has been interfering with his walking   Pt unable to provide hx   But no fever no diarrhea   He used to smoke but no any more does  not drink  His wife has been sick recently as well; 12/28 CXR indicated The appearance the chest suggests progressive worsening severe  bronchitis, as above.  Nursing informed SLP pt "coughing with liquids and pocketing food" and requiring breathing tx from RT.  BSE generated. Type of Study: Bedside Swallow Evaluation Previous Swallow Assessment: n/a Diet Prior to this Study: Dysphagia 2 (chopped);Thin liquids Temperature Spikes Noted: No Respiratory Status: Nasal cannula History of Recent Intubation: No Behavior/Cognition: Alert;Cooperative;Confused;Requires cueing Oral Cavity Assessment: Within Functional Limits Oral Care Completed by SLP: Recent completion by staff Oral Cavity - Dentition: Adequate natural dentition;Missing dentition Vision: Functional for self-feeding Self-Feeding Abilities: Able to feed self;Needs assist Patient Positioning: Upright in bed Baseline Vocal Quality: Wet;Other (comment) (congested) Volitional Cough: Strong Volitional Swallow: Able to elicit    Oral/Motor/Sensory Function Overall Oral Motor/Sensory Function: Generalized oral weakness   Ice Chips Ice chips: Impaired Presentation: Spoon Oral Phase Impairments: Reduced lingual movement/coordination Oral Phase Functional Implications: Oral holding Pharyngeal Phase Impairments: Suspected delayed Swallow;Wet Vocal Quality;Multiple swallows;Cough - Immediate   Thin Liquid Thin Liquid: Impaired Presentation: Spoon Oral Phase Functional Implications: Oral holding Pharyngeal  Phase Impairments: Suspected delayed Swallow;Multiple swallows;Cough - Immediate;Wet Vocal Quality    Nectar Thick Nectar Thick Liquid: Impaired Presentation: Spoon Oral phase functional implications: Oral holding Pharyngeal Phase Impairments: Suspected delayed Swallow;Multiple swallows;Wet Vocal Quality;Cough - Delayed   Honey Thick Honey Thick Liquid: Impaired Presentation: Cup Oral Phase Functional Implications: Oral  holding;Prolonged oral transit Pharyngeal Phase Impairments: Suspected delayed Swallow;Cough - Immediate   Puree Puree: Impaired Presentation: Spoon Oral Phase Impairments: Reduced lingual movement/coordination Oral Phase Functional Implications: Oral holding;Prolonged oral transit Pharyngeal Phase Impairments: Suspected delayed Swallow;Multiple swallows   Solid     Solid: Not tested      Elvina Sidle, M.S., CCC-SLP 12/28/2021,10:55 AM

## 2021-12-28 NOTE — TOC Initial Note (Signed)
Transition of Care [facility]) - Initial/Assessment Note   Patient Details  Name: Corey Kelley MRN: [id number] Date of Birth: 10-28-1927  [facility]) CM/SW Contact:    Corey Don, LCSW Phone Number: 12/28/2021, 1:26 PM  Clinical Narrative: PT evaluation recommended SNF. CSW spoke with patient's daughter, Corey Kelley, regarding recommendations as patient is oriented to self only. Daughter agreeable to SNF. FL2 done; PASRR received. Initial referral faxed out for review. TOC awaiting bed offers.                 Expected Discharge Plan: Skilled Nursing Facility Barriers to Discharge: Continued Medical Work up, SNF Pending bed offer, Insurance Authorization  Patient Goals and CMS Choice Patient states their goals for this hospitalization and ongoing recovery are:: Go to rehab before returning home CMS Medicare.gov Compare Post Acute Care list provided to:: Patient Represenative (must comment) Choice offered to / list presented to : Adult Children  Expected Discharge Plan and Services In-house Referral: Clinical Social Work Post Acute Care Choice: [place] Living arrangements for the past 2 months: Single Family Home            DME Arranged: N/A DME Agency: NA  Prior Living Arrangements/Services Living arrangements for the past 2 months: Single Family Home Lives with:: Adult Children Patient language and need for interpreter reviewed:: Yes Do you feel safe going back to the place where you live?: Yes      Need for Family Participation in Patient Care: Yes (Comment) (Patient has dementia.) Care giver support system in place?: Yes (comment) Criminal Activity/Legal Involvement Pertinent to Current Situation/Hospitalization: No - Comment as needed  Activities of Daily Living Home Assistive Devices/Equipment: None ADL Screening (condition at time of admission) Patient's cognitive ability adequate to safely complete daily activities?: Yes Is the patient deaf or  have difficulty hearing?: No Does the patient have difficulty seeing, even when wearing glasses/contacts?: No Does the patient have difficulty concentrating, remembering, or making decisions?: Yes Patient able to express need for assistance with ADLs?: Yes Does the patient have difficulty dressing or bathing?: Yes Independently performs ADLs?: Yes (appropriate for developmental age) Does the patient have difficulty walking or climbing stairs?: No Weakness of Legs: None Weakness of Arms/Hands: None  Permission Sought/Granted Permission sought to share information with : Facility Art therapist granted to share information with : Yes, Verbal Permission Granted Permission granted to share info w AGENCY: SNFs  Emotional Assessment Orientation: : Oriented to Self Alcohol / Substance Use: Not Applicable Psych Involvement: No (comment)  Admission diagnosis:  Influenza A [J10.1] AKI (acute kidney injury) ([place]) [N17.9] Patient Active Problem List   Diagnosis Date Noted   SBO (small bowel obstruction) ([place]) 12/27/2021   Acute urinary retention 12/27/2021   Influenza A 12/27/2021   AKI (acute kidney injury) ([place]) 12/26/2021   Essential hypertension 12/26/2021   Debility 12/26/2021   BPH (benign prostatic hyperplasia) 12/26/2021   Influenza 12/26/2021   Hypokalemia 12/26/2021   Leg edema, right 12/26/2021   PCP:  Lorene Dy, MD Pharmacy:   [place], [place] - [address] [place] [address] Phone: [phone] Fax: [phone]  Social Determinants of Health (SDOH) Social History: SDOH Screenings   Tobacco Use: Medium Risk (12/27/2021)   SDOH Interventions: N/A  Readmission Risk Interventions     No data to display

## 2021-12-28 NOTE — Progress Notes (Signed)
[facility] PROGRESS NOTE    Progress Note  Corey Kelley  HYW:[id number] DOB: [birth year] DOA: 12/26/2021 PCP: Lorene Dy, MD     Brief Narrative:   Corey Kelley is an 88 y.o. male past medical history significant for dementia, hypertension BPH comes in with falls started on the day of admission denies any prodromal symptoms except for coughing.,  Family relates he has had lower extremity edema right more than the left patient is unable to provide a history due to his dementia.  The family also relates that the wife has been sick    Assessment/Plan:   AKI (acute kidney injury) [facility]): Probably prerenal azotemia in the setting of ACE inhibitor and Norvasc. Antihypertensive medications were held. With a baseline creatinine of around 1. Was started on IV fluids his creatinine returned to baseline. SLP at risk of aspiration, placed n.p.o.  Rhabdomyolysis: Improved with IV fluid resuscitation CK 1600 this morning.  Influenza A: Probably contributing to poor oral intake. On Tamiflu p.o. for 5 days. Check a swallowing eval patient is having difficulty with secretions.  Essential hypertension Blood pressures relatively well-controlled continue to hold antihypertensive medication.  Hypokalemia: Replete orally now resolved.  Magnesium level is 1.7.  Right lower extremity edema: Film x-ray showed no acute findings lower extremity Dopplers pending. There is no signs of infection.  Acute urinary retention/BPH: Was found to have 600 cc on bladder scan was continue on Flomax. Foley catheter has been inserted. Will give him a voiding trial in 2 days.  Advance dementia: Currently on no medications.     DVT prophylaxis: lovenox Family Communication:none Status is: Observation The patient will require care spanning > 2 midnights and should be moved to inpatient because: Acute kidney injury and rhabdomyolysis    Code Status:     Code Status Orders  (From  admission, onward)           Start     Ordered   12/26/21 2310  Full code  Continuous       Question:  By:  Answer:  Consent: discussion documented in EHR   12/26/21 2310           Code Status History     This patient has a current code status but no historical code status.         IV Access:   Peripheral IV   Procedures and diagnostic studies:   DG Chest Port 1 View  Result Date: 12/28/2021 CLINICAL DATA:  88 year old male with history of shortness of breath. EXAM: PORTABLE CHEST 1 VIEW COMPARISON:  Chest x-ray 12/26/2021. FINDINGS: Lung volumes are normal. No consolidative airspace disease. However, there is diffusely increased interstitial prominence and widespread peribronchial cuffing, which has worsened compared to the prior study. No pleural effusions. No pneumothorax. No pulmonary nodule or mass noted. Pulmonary vasculature and the cardiomediastinal silhouette are within normal limits. Atherosclerosis in the thoracic aorta. IMPRESSION: 1. The appearance the chest suggests progressive worsening severe bronchitis, as above. 2. Aortic atherosclerosis. Electronically Signed   By: Vinnie Langton M.D.   On: 12/28/2021 06:36   VAS [place] LOWER EXTREMITY VENOUS (DVT)  Result Date: 12/27/2021  Lower Venous DVT Study Patient Name:  Corey Kelley  Date of Exam:   12/27/2021 Medical Rec #: [id number]     Accession #:    [id number] Date of Birth: 01/06/1927     Patient Gender: M Patient Age:   88 years Exam Location:  [facility] Procedure:      VAS  [place] LOWER EXTREMITY VENOUS (DVT) Referring Phys: ANASTASSIA DOUTOVA --------------------------------------------------------------------------------  Indications: Swelling.  Comparison Study: No previous exams Performing Technologist: Jody Hill RVT, RDMS  Examination Guidelines: A complete evaluation includes B-mode imaging, spectral Doppler, color Doppler, and power Doppler as needed of all accessible portions of each vessel.  Bilateral testing is considered an integral part of a complete examination. Limited examinations for reoccurring indications may be performed as noted. The reflux portion of the exam is performed with the patient in reverse Trendelenburg.  +---------+---------------+---------+-----------+----------+--------------+ RIGHT    CompressibilityPhasicitySpontaneityPropertiesThrombus Aging +---------+---------------+---------+-----------+----------+--------------+ CFV      Full           Yes      Yes                                 +---------+---------------+---------+-----------+----------+--------------+ SFJ      Full                                                        +---------+---------------+---------+-----------+----------+--------------+ FV Prox  Full           Yes      Yes                                 +---------+---------------+---------+-----------+----------+--------------+ FV Mid   Full           Yes      Yes                                 +---------+---------------+---------+-----------+----------+--------------+ FV DistalFull           Yes      Yes                                 +---------+---------------+---------+-----------+----------+--------------+ PFV      Full                                                        +---------+---------------+---------+-----------+----------+--------------+ POP      Full           Yes      Yes                                 +---------+---------------+---------+-----------+----------+--------------+ PTV      Full                                                        +---------+---------------+---------+-----------+----------+--------------+ PERO     Full                                                        +---------+---------------+---------+-----------+----------+--------------+   +----+---------------+---------+-----------+----------+--------------+  LEFTCompressibilityPhasicitySpontaneityPropertiesThrombus Aging +----+---------------+---------+-----------+----------+--------------+ CFV Full           Yes      Yes                                 +----+---------------+---------+-----------+----------+--------------+     Summary: RIGHT: - There is no evidence of deep vein thrombosis in the lower extremity.  - No cystic structure found in the popliteal fossa. Subcutaneous edema is area of calf and ankle.  LEFT: - No evidence of common femoral vein obstruction.  *See table(s) above for measurements and observations. Electronically signed by Deitra Mayo MD on 12/27/2021 at 2:31:10 PM.    Final    ECHOCARDIOGRAM COMPLETE  Result Date: 12/27/2021    ECHOCARDIOGRAM REPORT   Patient Name:   Corey Kelley Date of Exam: 12/27/2021 Medical Rec #:  [id number]    Height:       67.0 in Accession #:    [id number]   Weight:       179.0 lb Date of Birth:  September 08, 1927    BSA:          1.929 m Patient Age:    23 years     BP:           129/76 mmHg Patient Gender: M            HR:           81 bpm. Exam Location:  Inpatient Procedure: 2D Echo Indications:    syncope  History:        Patient has no prior history of Echocardiogram examinations.                 Risk Factors:Hypertension.  Sonographer:    Harvie Junior Referring Phys: [place]  Sonographer Comments: Technically difficult study due to poor echo windows. Image acquisition challenging due to respiratory motion. IMPRESSIONS  1. Left ventricular ejection fraction, by estimation, is 60 to 65%. The left ventricle has normal function. The left ventricle has no regional wall motion abnormalities. There is mild concentric left ventricular hypertrophy. Left ventricular diastolic parameters are consistent with Grade I diastolic dysfunction (impaired relaxation).  2. Right ventricular systolic function is normal. The right ventricular size is normal. There is normal pulmonary artery systolic  pressure.  3. The mitral valve is grossly normal. Trivial mitral valve regurgitation. No evidence of mitral stenosis.  4. The aortic valve is tricuspid. There is moderate calcification of the aortic valve. There is moderate thickening of the aortic valve. Aortic valve regurgitation is not visualized. Aortic valve sclerosis/calcification is present, without any evidence of aortic stenosis. Comparison(s): No prior Echocardiogram. FINDINGS  Left Ventricle: Left ventricular ejection fraction, by estimation, is 60 to 65%. The left ventricle has normal function. The left ventricle has no regional wall motion abnormalities. The left ventricular internal cavity size was normal in size. There is  mild concentric left ventricular hypertrophy. Left ventricular diastolic parameters are consistent with Grade I diastolic dysfunction (impaired relaxation). Right Ventricle: The right ventricular size is normal. Right vetricular wall thickness was not well visualized. Right ventricular systolic function is normal. There is normal pulmonary artery systolic pressure. The tricuspid regurgitant velocity is 2.40 m/s, and with an assumed right atrial pressure of 3 mmHg, the estimated right ventricular systolic pressure is 55.7 mmHg. Left Atrium: Left atrial size was normal in size. Right Atrium: Right atrial size was normal in size. Pericardium: There is  no evidence of pericardial effusion. Mitral Valve: The mitral valve is grossly normal. There is mild thickening of the mitral valve leaflet(s). There is mild calcification of the mitral valve leaflet(s). Trivial mitral valve regurgitation. No evidence of mitral valve stenosis. Tricuspid Valve: The tricuspid valve is normal in structure. Tricuspid valve regurgitation is trivial. Aortic Valve: The aortic valve is tricuspid. There is moderate calcification of the aortic valve. There is moderate thickening of the aortic valve. Aortic valve regurgitation is not visualized. Aortic valve  sclerosis/calcification is present, without any  evidence of aortic stenosis. Aortic valve mean gradient measures 7.0 mmHg. Aortic valve peak gradient measures 10.5 mmHg. Aortic valve area, by VTI measures 2.12 cm. Pulmonic Valve: The pulmonic valve was normal in structure. Pulmonic valve regurgitation is mild. Aorta: The aortic root is normal in size and structure. Venous: The inferior vena cava was not well visualized. IAS/Shunts: The atrial septum is grossly normal.  LEFT VENTRICLE PLAX 2D LVIDd:         4.20 cm     Diastology LVIDs:         3.00 cm     LV e' medial:    6.64 cm/s LV PW:         1.20 cm     LV E/e' medial:  11.3 LV IVS:        1.20 cm     LV e' lateral:   7.29 cm/s LVOT diam:     2.10 cm     LV E/e' lateral: 10.3 LV SV:         71 LV SV Index:   37 LVOT Area:     3.46 cm  LV Volumes (MOD) LV vol d, MOD A2C: 92.4 ml LV vol d, MOD A4C: 83.8 ml LV vol s, MOD A2C: 42.2 ml LV vol s, MOD A4C: 37.7 ml LV SV MOD A2C:     50.2 ml LV SV MOD A4C:     83.8 ml LV SV MOD BP:      49.3 ml RIGHT VENTRICLE RV Basal diam:  4.10 cm RV Mid diam:    3.20 cm RV S prime:     11.70 cm/s TAPSE (M-mode): 2.1 cm LEFT ATRIUM           Index        RIGHT ATRIUM           Index LA diam:      3.50 cm 1.81 cm/m   RA Area:     16.10 cm LA Vol (A4C): 32.7 ml 16.95 ml/m  RA Volume:   38.60 ml  20.01 ml/m  AORTIC VALVE                     PULMONIC VALVE AV Area (Vmax):    2.04 cm      PV Vmax:          0.76 m/s AV Area (Vmean):   2.00 cm      PV Peak grad:     2.3 mmHg AV Area (VTI):     2.12 cm      PR End Diast Vel: 4.00 msec AV Vmax:           162.00 cm/s AV Vmean:          125.000 cm/s AV VTI:            0.335 m AV Peak Grad:      10.5 mmHg AV Mean Grad:  7.0 mmHg LVOT Vmax:         95.30 cm/s LVOT Vmean:        72.300 cm/s LVOT VTI:          0.205 m LVOT/AV VTI ratio: 0.61  AORTA Ao Root diam: 3.50 cm Ao Asc diam:  3.20 cm MITRAL VALVE               TRICUSPID VALVE MV Area (PHT): 3.21 cm    TR Peak grad:   23.0  mmHg MV Decel Time: 236 msec    TR Vmax:        240.00 cm/s MR Peak grad: 40.9 mmHg MR Vmax:      319.67 cm/s  SHUNTS MV E velocity: 75.00 cm/s  Systemic VTI:  0.20 m MV A velocity: 74.60 cm/s  Systemic Diam: 2.10 cm MV E/A ratio:  1.01 Gwyndolyn Kaufman MD Electronically signed by Gwyndolyn Kaufman MD Signature Date/Time: 12/27/2021/1:22:53 PM    Final    CT Angio Chest Pulmonary Embolism (PE) W or WO Contrast  Result Date: 12/27/2021 CLINICAL DATA:  Increasing weakness and elevated D-dimer. Pulmonary embolism suspected. Bowel obstruction suspected. EXAM: CT ANGIOGRAPHY CHEST CT ABDOMEN AND PELVIS WITH CONTRAST TECHNIQUE: Multidetector CT imaging of the chest was performed using the standard protocol during bolus administration of intravenous contrast. Multiplanar CT image reconstructions and MIPs were obtained to evaluate the vascular anatomy. Multidetector CT imaging of the abdomen and pelvis was performed using the standard protocol during bolus administration of intravenous contrast. RADIATION DOSE REDUCTION: This exam was performed according to the departmental dose-optimization program which includes automated exposure control, adjustment of the mA and/or kV according to patient size and/or use of iterative reconstruction technique. CONTRAST:  16m OMNIPAQUE IOHEXOL 350 MG/ML SOLN COMPARISON:  Radiographs 12/26/2021 and MRI abdomen 11/07/2021 and CT abdomen and pelvis 01/26/2020 FINDINGS: CTA CHEST FINDINGS Cardiovascular: Satisfactory opacification of the central pulmonary arteries. Poor opacification of the segmental pulmonary arteries. No central pulmonary embolism. Mild cardiomegaly. No pericardial effusion. Coronary artery and aortic atherosclerotic calcification. Mediastinum/Nodes: No thoracic adenopathy by size. Unremarkable esophagus. Lungs/Pleura: Expiratory phase exam. Diffuse mild bronchial wall thickening. Scattered atelectasis/scarring in the lung bases. No pleural effusion or pneumothorax.  Musculoskeletal: No chest wall abnormality. Irregular area of sclerosis in the superior anterior T8 vertebral body likely a benign bone island. No destructive change. Review of the MIP images confirms the above findings. CT ABDOMEN and PELVIS FINDINGS Hepatobiliary: No destructive change. Unchanged benign cysts or hemangiomas in the liver. Unremarkable gallbladder. No biliary dilation. Pancreas: Unremarkable. Spleen: Unremarkable. Adrenals/Urinary Tract: Normal adrenal glands. Complex cystic lesions in the lower pole of the right kidney and mid posterior left kidney where previously evaluated with MRI on 11/07/2021. See that report for recommendations. Urinary calculi or hydronephrosis. Foley catheter and gas within the decompressed bladder. Stomach/Bowel: Large stool ball in the rectum. Normal caliber large and small bowel. No bowel wall thickening. Unremarkable stomach. Vascular/Lymphatic: Aortic atherosclerosis. Similar ectasia of the infrarenal abdominal aorta measuring 2.9 cm. No enlarged abdominal or pelvic lymph nodes. Reproductive: Enlarged prostate. Other: No free intraperitoneal fluid or air. Small fat containing bilateral inguinal hernias. Musculoskeletal: Similar sclerosis in the right superior pubic ramus. No acute osseous abnormality. Advanced degenerative change in the lower lumbar spine. Review of the MIP images confirms the above findings. IMPRESSION: 1. No central pulmonary embolism. The segmental arteries are not well opacified. 2. Mild bronchial wall thickening. 3. Coronary artery and Aortic Atherosclerosis (ICD10-I70.0). 1. No acute abnormality in the abdomen  or pelvis. 2. Complex cystic lesions in the lower pole of the right kidney and mid posterior left kidney where previously evaluated with MRI on 11/07/2021. See that report for recommendations. Electronically Signed   By: Placido Sou M.D.   On: 12/27/2021 02:34   CT ABDOMEN PELVIS W CONTRAST  Result Date: 12/27/2021 CLINICAL DATA:   Increasing weakness and elevated D-dimer. Pulmonary embolism suspected. Bowel obstruction suspected. EXAM: CT ANGIOGRAPHY CHEST CT ABDOMEN AND PELVIS WITH CONTRAST TECHNIQUE: Multidetector CT imaging of the chest was performed using the standard protocol during bolus administration of intravenous contrast. Multiplanar CT image reconstructions and MIPs were obtained to evaluate the vascular anatomy. Multidetector CT imaging of the abdomen and pelvis was performed using the standard protocol during bolus administration of intravenous contrast. RADIATION DOSE REDUCTION: This exam was performed according to the departmental dose-optimization program which includes automated exposure control, adjustment of the mA and/or kV according to patient size and/or use of iterative reconstruction technique. CONTRAST:  26m OMNIPAQUE IOHEXOL 350 MG/ML SOLN COMPARISON:  Radiographs 12/26/2021 and MRI abdomen 11/07/2021 and CT abdomen and pelvis 01/26/2020 FINDINGS: CTA CHEST FINDINGS Cardiovascular: Satisfactory opacification of the central pulmonary arteries. Poor opacification of the segmental pulmonary arteries. No central pulmonary embolism. Mild cardiomegaly. No pericardial effusion. Coronary artery and aortic atherosclerotic calcification. Mediastinum/Nodes: No thoracic adenopathy by size. Unremarkable esophagus. Lungs/Pleura: Expiratory phase exam. Diffuse mild bronchial wall thickening. Scattered atelectasis/scarring in the lung bases. No pleural effusion or pneumothorax. Musculoskeletal: No chest wall abnormality. Irregular area of sclerosis in the superior anterior T8 vertebral body likely a benign bone island. No destructive change. Review of the MIP images confirms the above findings. CT ABDOMEN and PELVIS FINDINGS Hepatobiliary: No destructive change. Unchanged benign cysts or hemangiomas in the liver. Unremarkable gallbladder. No biliary dilation. Pancreas: Unremarkable. Spleen: Unremarkable. Adrenals/Urinary Tract:  Normal adrenal glands. Complex cystic lesions in the lower pole of the right kidney and mid posterior left kidney where previously evaluated with MRI on 11/07/2021. See that report for recommendations. Urinary calculi or hydronephrosis. Foley catheter and gas within the decompressed bladder. Stomach/Bowel: Large stool ball in the rectum. Normal caliber large and small bowel. No bowel wall thickening. Unremarkable stomach. Vascular/Lymphatic: Aortic atherosclerosis. Similar ectasia of the infrarenal abdominal aorta measuring 2.9 cm. No enlarged abdominal or pelvic lymph nodes. Reproductive: Enlarged prostate. Other: No free intraperitoneal fluid or air. Small fat containing bilateral inguinal hernias. Musculoskeletal: Similar sclerosis in the right superior pubic ramus. No acute osseous abnormality. Advanced degenerative change in the lower lumbar spine. Review of the MIP images confirms the above findings. IMPRESSION: 1. No central pulmonary embolism. The segmental arteries are not well opacified. 2. Mild bronchial wall thickening. 3. Coronary artery and Aortic Atherosclerosis (ICD10-I70.0). 1. No acute abnormality in the abdomen or pelvis. 2. Complex cystic lesions in the lower pole of the right kidney and mid posterior left kidney where previously evaluated with MRI on 11/07/2021. See that report for recommendations. Electronically Signed   By: TPlacido SouM.D.   On: 12/27/2021 02:34   DG Abd 1 View  Result Date: 12/26/2021 CLINICAL DATA:  Weakness.  Constipation EXAM: ABDOMEN - 1 VIEW COMPARISON:  CT abdomen and pelvis 01/26/2020 FINDINGS: Prominent gas-filled loops of small and large bowel without definite dilation. Exam is limited by overlapping structures in the pelvis. No definite acute osseous abnormality. IMPRESSION: Prominent gas-filled loops of small and large bowel. If there is concern for obstruction CT is recommended for further evaluation. Electronically Signed   By: TDorothea Ogle  Stutzman M.D.    On: 12/26/2021 23:50   CT Head Wo Contrast  Result Date: 12/26/2021 CLINICAL DATA:  Recent falls EXAM: CT HEAD WITHOUT CONTRAST TECHNIQUE: Contiguous axial images were obtained from the base of the skull through the vertex without intravenous contrast. RADIATION DOSE REDUCTION: This exam was performed according to the departmental dose-optimization program which includes automated exposure control, adjustment of the mA and/or kV according to patient size and/or use of iterative reconstruction technique. COMPARISON:  01/26/2020 FINDINGS: Brain: No evidence of acute infarction, hemorrhage, hydrocephalus, extra-axial collection or mass lesion/mass effect. Chronic atrophic and ischemic changes are noted. Vascular: No hyperdense vessel or unexpected calcification. Skull: Normal. Negative for fracture or focal lesion. Sinuses/Orbits: No acute finding. Other: None. IMPRESSION: Chronic atrophic and ischemic changes without acute abnormality. Electronically Signed   By: Inez Catalina M.D.   On: 12/26/2021 22:09   DG Tibia/Fibula Right  Result Date: 12/26/2021 CLINICAL DATA:  Fall and trauma to the right lower extremity. EXAM: RIGHT TIBIA AND FIBULA - 2 VIEW; RIGHT FEMUR 2 VIEWS; RIGHT FOOT COMPLETE - 3+ VIEW COMPARISON:  Right hip radiograph dated 01/26/2020. FINDINGS: Evaluation is limited due to body habitus and soft tissue attenuation. There is no acute fracture or dislocation. There is moderate arthritic changes of the right knee. There is diffuse subcutaneous edema and soft tissue swelling of the dorsum of the foot. No radiopaque foreign object or soft tissue gas. IMPRESSION: 1. No acute fracture or dislocation. 2. Moderate arthritic changes of the right knee. 3. Diffuse subcutaneous edema and soft tissue swelling of the dorsum of the foot. Electronically Signed   By: Anner Crete M.D.   On: 12/26/2021 22:07   DG Foot Complete Right  Result Date: 12/26/2021 CLINICAL DATA:  Fall and trauma to the right  lower extremity. EXAM: RIGHT TIBIA AND FIBULA - 2 VIEW; RIGHT FEMUR 2 VIEWS; RIGHT FOOT COMPLETE - 3+ VIEW COMPARISON:  Right hip radiograph dated 01/26/2020. FINDINGS: Evaluation is limited due to body habitus and soft tissue attenuation. There is no acute fracture or dislocation. There is moderate arthritic changes of the right knee. There is diffuse subcutaneous edema and soft tissue swelling of the dorsum of the foot. No radiopaque foreign object or soft tissue gas. IMPRESSION: 1. No acute fracture or dislocation. 2. Moderate arthritic changes of the right knee. 3. Diffuse subcutaneous edema and soft tissue swelling of the dorsum of the foot. Electronically Signed   By: Anner Crete M.D.   On: 12/26/2021 22:07   DG Femur Min 2 Views Right  Result Date: 12/26/2021 CLINICAL DATA:  Fall and trauma to the right lower extremity. EXAM: RIGHT TIBIA AND FIBULA - 2 VIEW; RIGHT FEMUR 2 VIEWS; RIGHT FOOT COMPLETE - 3+ VIEW COMPARISON:  Right hip radiograph dated 01/26/2020. FINDINGS: Evaluation is limited due to body habitus and soft tissue attenuation. There is no acute fracture or dislocation. There is moderate arthritic changes of the right knee. There is diffuse subcutaneous edema and soft tissue swelling of the dorsum of the foot. No radiopaque foreign object or soft tissue gas. IMPRESSION: 1. No acute fracture or dislocation. 2. Moderate arthritic changes of the right knee. 3. Diffuse subcutaneous edema and soft tissue swelling of the dorsum of the foot. Electronically Signed   By: Anner Crete M.D.   On: 12/26/2021 22:07   DG Chest Port 1 View  Result Date: 12/26/2021 CLINICAL DATA:  Weakness. Questionable sepsis evaluate for abnormality EXAM: PORTABLE CHEST 1 VIEW COMPARISON:  08/11/2013 FINDINGS:  Low lung volumes. Stable cardiomediastinal silhouette. Aortic atherosclerotic calcification. Bibasilar atelectasis. Unchanged chronic bronchitic thickening. No displaced rib fractures. IMPRESSION:  Unchanged chronic bronchitic thickening. Electronically Signed   By: Placido Sou M.D.   On: 12/26/2021 21:01     Medical Consultants:   None.   Subjective:    Corey Kelley having trouble with secretions.  Objective:    Vitals:   12/28/21 0405 12/28/21 0740 12/28/21 0817 12/28/21 0938  BP: 116/69   129/70  Pulse: 93   92  Resp: 20   18  Temp: 99.8 F (37.7 C) 98.3 F (36.8 C)  97.7 F (36.5 C)  TempSrc: Oral Oral  Oral  SpO2: 93% 94% 95% 99%  Weight:      Height:       SpO2: 99 % O2 Flow Rate (L/min): 4 L/min FiO2 (%): 36 %   Intake/Output Summary (Last 24 hours) at 12/28/2021 1018 Last data filed at 12/28/2021 0957 Gross per 24 hour  Intake 1829.28 ml  Output 1850 ml  Net -20.72 ml    Filed Weights   12/27/21 2350  Weight: 91.4 kg    Exam: General exam: In no acute distress, having trouble with secretion Respiratory system: Good air movement and crackles at bases. Cardiovascular system: S1 & S2 heard, RRR. No JVD. Gastrointestinal system: Abdomen is nondistended, soft and nontender.  Extremities: 2+ lower extremity edema Skin: No rashes, lesions or ulcers Psychiatry: Judgment and insight appear intact having trouble   Data Reviewed:    Labs: Basic Metabolic Panel: Recent Labs  Lab 12/26/21 0049 12/26/21 2102 12/26/21 2102 12/27/21 0510 12/28/21 0913  NA  --  134*  --  135 133*  K  --  3.4*   < > 3.8 3.5  CL  --  101  --  100 102  CO2  --  25  --  26 24  GLUCOSE  --  112*  --  102* 101*  BUN  --  21  --  18 18  CREATININE  --  1.35*  --  1.14 1.02  CALCIUM  --  9.1  --  8.8* 8.2*  MG 1.7  --   --  1.7  --   PHOS 3.0  --   --  2.9  --    < > = values in this interval not displayed.    GFR Estimated Creatinine Clearance: 49.8 mL/min (by C-G formula based on SCr of 1.02 mg/dL). Liver Function Tests: Recent Labs  Lab 12/26/21 2102 12/27/21 0510  AST 92* 105*  ALT 25 29  ALKPHOS 47 51  BILITOT 1.1 1.4*  PROT 6.8 7.3  ALBUMIN  3.7 3.9    No results for input(s): "LIPASE", "AMYLASE" in the last 168 hours. No results for input(s): "AMMONIA" in the last 168 hours. Coagulation profile Recent Labs  Lab 12/26/21 2102  INR 1.1    COVID-19 Labs  Recent Labs    12/26/21 0049  DDIMER 5.92*     Lab Results  Component Value Date   SARSCOV2NAA NEGATIVE 12/26/2021   [place] NEGATIVE 01/26/2020    CBC: Recent Labs  Lab 12/26/21 2102 12/27/21 0510  WBC 7.9 7.7  NEUTROABS 6.5  --   HGB 16.2 16.2  HCT 47.3 47.3  MCV 96.7 96.3  PLT 163 146*    Cardiac Enzymes: Recent Labs  Lab 12/26/21 0049 12/28/21 0913  CKTOTAL 3,399* 1,607*    BNP (last 3 results) No results for input(s): "PROBNP" in the last 8760  hours. CBG: No results for input(s): "GLUCAP" in the last 168 hours. D-Dimer: Recent Labs    12/26/21 0049  DDIMER 5.92*    Hgb A1c: No results for input(s): "HGBA1C" in the last 72 hours. Lipid Profile: No results for input(s): "CHOL", "HDL", "LDLCALC", "TRIG", "CHOLHDL", "LDLDIRECT" in the last 72 hours. Thyroid function studies: Recent Labs    12/27/21 0049  TSH 0.337*    Anemia work up: No results for input(s): "VITAMINB12", "FOLATE", "FERRITIN", "TIBC", "IRON", "RETICCTPCT" in the last 72 hours. Sepsis Labs: Recent Labs  Lab 12/26/21 2102 12/27/21 0049 12/27/21 0510  WBC 7.9  --  7.7  LATICACIDVEN 1.8 1.4  --     Microbiology Recent Results (from the past 240 hour(s))  Resp panel by RT-PCR (RSV, Flu A&B, Covid) Anterior Nasal Swab     Status: Abnormal   Collection Time: 12/26/21  9:09 PM   Specimen: Anterior Nasal Swab  Result Value Ref Range Status   SARS Coronavirus 2 by RT PCR NEGATIVE NEGATIVE Final    Comment: (NOTE) SARS-CoV-2 target nucleic acids are NOT DETECTED.  The SARS-CoV-2 RNA is generally detectable in upper respiratory specimens during the acute phase of infection. The lowest concentration of SARS-CoV-2 viral copies this assay can detect  is 138 copies/mL. A negative result does not preclude SARS-Cov-2 infection and should not be used as the sole basis for treatment or other patient management decisions. A negative result may occur with  improper specimen collection/handling, submission of specimen other than nasopharyngeal swab, presence of viral mutation(s) within the areas targeted by this assay, and inadequate number of viral copies(<138 copies/mL). A negative result must be combined with clinical observations, patient history, and epidemiological information. The expected result is Negative.  Fact Sheet for Patients:  EntrepreneurPulse.com.au  Fact Sheet for Healthcare Providers:  IncredibleEmployment.be  This test is no t yet approved or cleared by the [place] FDA and  has been authorized for detection and/or diagnosis of SARS-CoV-2 by FDA under an Emergency Use Authorization (EUA). This EUA will remain  in effect (meaning this test can be used) for the duration of the COVID-19 declaration under Section 564(b)(1) of the Act, 21 U.S.C.section 360bbb-3(b)(1), unless the authorization is terminated  or revoked sooner.       Influenza A by PCR POSITIVE (A) NEGATIVE Final   Influenza B by PCR NEGATIVE NEGATIVE Final    Comment: (NOTE) The Xpert Xpress SARS-CoV-2/FLU/RSV plus assay is intended as an aid in the diagnosis of influenza from Nasopharyngeal swab specimens and should not be used as a sole basis for treatment. Nasal washings and aspirates are unacceptable for Xpert Xpress SARS-CoV-2/FLU/RSV testing.  Fact Sheet for Patients: EntrepreneurPulse.com.au  Fact Sheet for Healthcare Providers: IncredibleEmployment.be  This test is not yet approved or cleared by the [place] FDA and has been authorized for detection and/or diagnosis of SARS-CoV-2 by FDA under an Emergency Use Authorization (EUA). This EUA will remain in  effect (meaning this test can be used) for the duration of the COVID-19 declaration under Section 564(b)(1) of the Act, 21 U.S.C. section 360bbb-3(b)(1), unless the authorization is terminated or revoked.     Resp Syncytial Virus by PCR NEGATIVE NEGATIVE Final    Comment: (NOTE) Fact Sheet for Patients: EntrepreneurPulse.com.au  Fact Sheet for Healthcare Providers: IncredibleEmployment.be  This test is not yet approved or cleared by the [place] FDA and has been authorized for detection and/or diagnosis of SARS-CoV-2 by FDA under an Emergency Use Authorization (EUA). This EUA will  remain in effect (meaning this test can be used) for the duration of the COVID-19 declaration under Section 564(b)(1) of the Act, 21 U.S.C. section 360bbb-3(b)(1), unless the authorization is terminated or revoked.  Performed at [facility], [place] [address] [place], [place] [address]   Urine Culture     Status: Abnormal   Collection Time: 12/27/21 12:23 AM   Specimen: In/Out Cath Urine  Result Value Ref Range Status   Specimen Description   Final    IN/OUT CATH URINE Performed at [place] [address] [place], [place] [address]    Special Requests   Final    NONE Performed at [facility], [place] [address], [place], [place] [address]    Culture MULTIPLE SPECIES PRESENT, SUGGEST RECOLLECTION (A)  Final   Report Status 12/28/2021 FINAL  Final     Medications:    albuterol  3 mL Inhalation TID   Chlorhexidine Gluconate Cloth  6 each Topical Daily   enoxaparin (LOVENOX) injection  30 mg Subcutaneous Q24H   guaiFENesin  600 mg Oral BID   influenza vaccine adjuvanted  0.5 mL Intramuscular Tomorrow-1000   metoprolol tartrate  12.5 mg Oral BID   oseltamivir  30 mg Oral BID   tamsulosin  0.4 mg Oral QPC supper   Continuous Infusions:      LOS: 1 day   Charlynne Cousins  [facility]  12/28/2021, 10:18 AM

## 2021-12-28 NOTE — Plan of Care (Signed)

## 2021-12-28 NOTE — Progress Notes (Signed)
Initial Nutrition Assessment  DOCUMENTATION CODES:   Non-severe (moderate) malnutrition in context of chronic illness  INTERVENTION:  - Advance diet as medically appropriate pending SLP recommendations.  - Once diet advanced recommend Ensure Enlive po BID, each supplement provides 350 kcal and 20 grams of protein. - Consider daily multivitamin.  - Monitor weight trends.   NUTRITION DIAGNOSIS:   Moderate Malnutrition related to chronic illness as evidenced by mild fat depletion, mild muscle depletion.  GOAL:   Patient will meet greater than or equal to 90% of their needs  MONITOR:   Diet advancement, Weight trends  REASON FOR ASSESSMENT:   Consult Assessment of nutrition requirement/status  ASSESSMENT:   88 y.o. male with medical history significant of Dementia, HTN, HLD, BPH who presented with  falls, fatigue. Found to have influenza A, AKI, rhabdomyolysis.   Patient asleep at time of visit, wife present at bedside. Wife is unsure of patient's UBW but did not feel he had lost any weight recently.  She reports patient typically gets up late and has a late breakfast of a bowl of cereal and a dinner in the evening. Does not drink nutrition supplements however wife states they ran out of milk the past week and patient had been using Equate as milk in his bowl of cereal. It is noted that both patient and wife have dementia.  Wife reports patient ate fairly well at breakfast this morning before being made NPO. Wife feels patient would be agreeable to receive nutrition supplements during admission. Patient awoke by end of interview and agreeable to receive supplements when diet able to be advanced.  Per MD note today, patient made NPO due to aspiration risk. SLP eval ordered and results pending.   Medications reviewed and include: -  Labs reviewed:  Na 133   NUTRITION - FOCUSED PHYSICAL EXAM:  Flowsheet Row Most Recent Value  Orbital Region Mild depletion  Upper Arm Region  No depletion  Thoracic and Lumbar Region No depletion  Buccal Region Mild depletion  Temple Region Mild depletion  Clavicle Bone Region Mild depletion  Clavicle and Acromion Bone Region No depletion  Scapular Bone Region Unable to assess  Dorsal Hand No depletion  Patellar Region No depletion  Anterior Thigh Region No depletion  Posterior Calf Region No depletion  Edema (RD Assessment) None  Hair Reviewed  Eyes Reviewed  Mouth Reviewed  Skin Reviewed  Nails Reviewed       Diet Order:   Diet Order             Diet NPO time specified  Diet effective now                   EDUCATION NEEDS:  Not appropriate for education at this time  Skin:  Skin Assessment: Reviewed RN Assessment  Last BM:  12/27  Height:  Ht Readings from Last 1 Encounters:  12/27/21 '5\' 7"'$  (1.702 m)   Weight:  Wt Readings from Last 1 Encounters:  12/27/21 91.4 kg    BMI:  Body mass index is 31.56 kg/m.  Estimated Nutritional Needs:  Kcal:  1700-1850 kcals Protein:  90-110 grams Fluid:  >/= 1.7L    Samson Frederic RD, LDN For contact information, refer to [facility].

## 2021-12-28 NOTE — NC FL2 (Signed)
[place] LEVEL OF CARE FORM     IDENTIFICATION  Patient Name: Corey Kelley Birthdate: [birth year] Sex: male Admission Date (Current Location): 12/26/2021  [facility] and [place] Number:  Herbalist and Address:  [facility],  [place], [address]      Provider Number: [id number]  Attending Physician Name and Address:  Charlynne Cousins, MD  Relative Name and Phone Number:  [name] (daughter) Ph: [phone]    Current Level of Care: Hospital Recommended Level of Care: [place] Prior Approval Number:    Date Approved/Denied:   PASRR Number: [id number]  Discharge Plan: SNF    Current Diagnoses: Patient Active Problem List   Diagnosis Date Noted   SBO (small bowel obstruction) ([place]) 12/27/2021   Acute urinary retention 12/27/2021   Influenza A 12/27/2021   AKI (acute kidney injury) ([place]) 12/26/2021   Essential hypertension 12/26/2021   Debility 12/26/2021   BPH (benign prostatic hyperplasia) 12/26/2021   Influenza 12/26/2021   Hypokalemia 12/26/2021   Leg edema, right 12/26/2021    Orientation RESPIRATION BLADDER Height & Weight     Self  O2 (4L/min) Incontinent Weight: 201 lb 8 oz (91.4 kg) Height:  '5\' 7"'$  (170.2 cm)  BEHAVIORAL SYMPTOMS/MOOD NEUROLOGICAL BOWEL NUTRITION STATUS     (N/A) Continent Diet (Currently NPO. See discharge summary for updated diet.)  AMBULATORY STATUS COMMUNICATION OF NEEDS Skin   Extensive Assist Verbally Skin abrasions (Abrasion: left knee)                       Personal Care Assistance Level of Assistance  Bathing, Feeding, Dressing Bathing Assistance: Maximum assistance Feeding assistance: Independent Dressing Assistance: Maximum assistance     Functional Limitations Info  Sight, Hearing, Speech Sight Info: Adequate Hearing Info: Adequate Speech Info: Adequate    SPECIAL CARE FACTORS FREQUENCY  PT (By licensed PT), OT (By licensed OT)     PT  Frequency: 5x's/week OT Frequency: 5x's/week            Contractures Contractures Info: Not present    Additional Factors Info  Code Status, Allergies, Isolation Precautions Code Status Info: Full Allergies Info: NKA     Isolation Precautions Info: Droplet precautions (patient tested positive for flu on 12/26)     Current Medications (12/28/2021):  This is the current hospital active medication list Current Facility-Administered Medications  Medication Dose Route Frequency Provider Last Rate Last Admin   0.9 %  sodium chloride infusion   Intravenous Continuous Charlynne Cousins, MD 75 mL/hr at 12/28/21 1048 Restarted at 12/28/21 1048   acetaminophen (TYLENOL) tablet 650 mg  650 mg Oral Q6H PRN Toy Baker, MD   650 mg at 12/28/21 0009   Or   acetaminophen (TYLENOL) suppository 650 mg  650 mg Rectal Q6H PRN Toy Baker, MD       albuterol (PROVENTIL) (2.5 MG/3ML) 0.083% nebulizer solution 2.5 mg  2.5 mg Nebulization Q2H PRN Doutova, Anastassia, MD   2.5 mg at 12/28/21 1337   albuterol (PROVENTIL) (2.5 MG/3ML) 0.083% nebulizer solution 3 mL  3 mL Inhalation TID Charlynne Cousins, MD       Chlorhexidine Gluconate Cloth 2 % PADS 6 each  6 each Topical Daily Charlynne Cousins, MD   6 each at 12/28/21 0922   enoxaparin (LOVENOX) injection 30 mg  30 mg Subcutaneous Q24H Doutova, Anastassia, MD   30 mg at 12/28/21 0920   guaiFENesin (MUCINEX) 12 hr  tablet 600 mg  600 mg Oral BID Toy Baker, MD   600 mg at 12/28/21 2202   HYDROcodone-acetaminophen (NORCO/VICODIN) 5-325 MG per tablet 1-2 tablet  1-2 tablet Oral Q4H PRN Toy Baker, MD       influenza vaccine adjuvanted (FLUAD) injection 0.5 mL  0.5 mL Intramuscular Tomorrow-1000 Charlynne Cousins, MD       metoprolol tartrate (LOPRESSOR) tablet 12.5 mg  12.5 mg Oral BID Charlynne Cousins, MD   12.5 mg at 12/28/21 0921   ondansetron (ZOFRAN) injection 4 mg  4 mg Intravenous Q6H PRN Charlynne Cousins, MD       oseltamivir (TAMIFLU) capsule 30 mg  30 mg Oral BID Toy Baker, MD   30 mg at 12/28/21 0920   potassium chloride SA (KLOR-CON M) CR tablet 40 mEq  40 mEq Oral BID Charlynne Cousins, MD   40 mEq at 12/28/21 1023   tamsulosin (FLOMAX) capsule 0.4 mg  0.4 mg Oral QPC supper Toy Baker, MD   0.4 mg at 12/27/21 1656   traMADol (ULTRAM) tablet 50 mg  50 mg Oral TID PRN Toy Baker, MD         Discharge Medications: Please see discharge summary for a list of discharge medications.  Relevant Imaging Results:  Relevant Lab Results:   Additional Information SSN: [id number]  Sherie Don, LCSW

## 2021-12-29 ENCOUNTER — Inpatient Hospital Stay (HOSPITAL_COMMUNITY): Payer: Medicare Other

## 2021-12-29 DIAGNOSIS — J101 Influenza due to other identified influenza virus with other respiratory manifestations: Secondary | ICD-10-CM | POA: Diagnosis not present

## 2021-12-29 DIAGNOSIS — N179 Acute kidney failure, unspecified: Secondary | ICD-10-CM | POA: Diagnosis not present

## 2021-12-29 DIAGNOSIS — E440 Moderate protein-calorie malnutrition: Secondary | ICD-10-CM | POA: Insufficient documentation

## 2021-12-29 DIAGNOSIS — R338 Other retention of urine: Secondary | ICD-10-CM | POA: Diagnosis not present

## 2021-12-29 DIAGNOSIS — I10 Essential (primary) hypertension: Secondary | ICD-10-CM | POA: Diagnosis not present

## 2021-12-29 LAB — BASIC METABOLIC PANEL
Anion gap: 4 — ABNORMAL LOW (ref 5–15)
BUN: 15 mg/dL (ref 8–23)
CO2: 26 mmol/L (ref 22–32)
Calcium: 8.2 mg/dL — ABNORMAL LOW (ref 8.9–10.3)
Chloride: 106 mmol/L (ref 98–111)
Creatinine, Ser: 0.91 mg/dL (ref 0.61–1.24)
GFR, Estimated: >60 mL/min (ref >60)
Glucose, Bld: 94 mg/dL (ref 70–99)
Potassium: 3.7 mmol/L (ref 3.5–5.1)
Sodium: 136 mmol/L (ref 135–145)

## 2021-12-29 MED ORDER — ENSURE ENLIVE PO LIQD
237.0000 mL | Freq: Two times a day (BID) | ORAL | Status: DC
  Administered 2021-12-30 – 2021-12-31 (×4): 237 mL via ORAL

## 2021-12-29 NOTE — Progress Notes (Signed)
[facility] PROGRESS NOTE    Progress Note  Corey Kelley  DGU:[id number] DOB: 1927-03-06 DOA: 12/26/2021 PCP: Lorene Dy, MD     Brief Narrative:   Corey Kelley is an 88 y.o. male past medical history significant for dementia, hypertension BPH comes in with falls started on the day of admission denies any prodromal symptoms except for coughing.,  Family relates he has had lower extremity edema right more than the left patient is unable to provide a history due to his dementia.  The family also relates that the wife has been sick    Assessment/Plan:   AKI (acute kidney injury) [facility]): Probably prerenal azotemia in the setting of ACE inhibitor and Norvasc. Continue to hold antihypertensive medication. With a baseline creatinine of around 1. Was started on IV fluids his creatinine returned to baseline. Speech evaluation was done, he is moderate to high risk for aspiration. PT evaluated the patient recommended skilled nursing facility.  Rhabdomyolysis: Improved with IV fluid resuscitation CK 1600 this morning.  Influenza A: Probably contributing to poor oral intake. On Tamiflu p.o. for 5 days. Speech evaluated the patient moderate to high risk of aspiration.  Essential hypertension Blood pressure slightly above goal continue to monitor. Continue to hold antihypertensive medication.  Hypokalemia: Replete orally now resolved.  Magnesium level is 1.7.  Right lower extremity edema: Film x-ray showed no acute findings lower extremity Dopplers pending. There is no signs of infection.  Acute urinary retention/BPH: Was found to have 600 cc on bladder scan was continue on Flomax. Foley catheter has been inserted. Will give him a voiding trial in 2 days.  Advance dementia: Currently on no medications.     DVT prophylaxis: lovenox Family Communication:none Status is: Observation The patient will require care spanning > 2 midnights and should be moved to inpatient  because: Acute kidney injury and rhabdomyolysis    Code Status:     Code Status Orders  (From admission, onward)           Start     Ordered   12/26/21 2310  Full code  Continuous       Question:  By:  Answer:  Consent: discussion documented in EHR   12/26/21 2310           Code Status History     This patient has a current code status but no historical code status.         IV Access:   Peripheral IV   Procedures and diagnostic studies:   DG Swallowing Func-Speech Pathology  Result Date: 12/29/2021 Table formatting from the original result was not included. Objective Swallowing Evaluation: Type of Study: Bedside Swallow Evaluation  Patient Details Name: Corey Kelley MRN: [id number] Date of Birth: January 15, 1927 Today's Date: 12/29/2021 Time: SLP Start Time (ACUTE ONLY): 0854 -SLP Stop Time (ACUTE ONLY): 0913 SLP Time Calculation (min) (ACUTE ONLY): 19 min Past Medical History: Past Medical History: Diagnosis Date  Hypercholesteremia   Hypertension   Prostate nodule  Past Surgical History: Past Surgical History: Procedure Laterality Date  HERNIA REPAIR  1950's HPI: Corey Kelley is a 88 y.o. male with medical history significant of Dementia, HTN, HLD, BPH        Presented with  falls, fatigue  Pt have had 3 falls today had to call EMS to get him up from the ground  Not sure if he hit his head  BP 146/72  Denies CP , SOB or fever but has been coughing    Has a right  leg swelling    Family feels that both legs have been swollen for months but right more than left   It has been interfering with his walking   Pt unable to provide hx   But no fever no diarrhea   He used to smoke but no any more does not drink  His wife has been sick recently as well; 12/28 CXR indicated The appearance the chest suggests progressive worsening severe  bronchitis, as above.  Nursing informed SLP pt "coughing with liquids and pocketing food" and requiring breathing tx from RT.  BSE generated.  Subjective:  pt awake in chair  Recommendations for follow up therapy are one component of a multi-disciplinary discharge planning process, led by the attending physician.  Recommendations may be updated based on patient status, additional functional criteria and insurance authorization. Assessment / Plan / Recommendation   12/29/2021   9:25 AM Clinical Impressions Clinical Impression Patient presents with mild oropharyngeal dysphagia with obstructive component due to appearance of cervical osteophyte impacting pharyngeal clearance resulting in mild pharyngeal residuals (that mix with secretions) across all consistencies.  Pt with impaired sensation to retention when minimal but conducts reflexive dry swallows to help decrease.   Following solid with liquids also helpful to decrease retention.  Prolonged oral transiting of pudding with lingual pumping noted due to mild weakness.   Barium tablet taken with thin momentarily stalled at valleculae and pyriform sinus, requiring several sequential boluses of thin to clear. Laryngeal penetration of thin when taking barium tablet observed, thus for safety, recommend medications be taken with puree- cut or crush if large and follow with liquids.  Patient educated to findings, recommendations.  Of note, his voice was occasional gurgly after MBS (when larynx was clear) - suspect some component of secretion aspiration. SLP Visit Diagnosis Dysphagia, oropharyngeal phase (R13.12) Impact on safety and function Moderate aspiration risk     12/29/2021   9:25 AM Treatment Recommendations Treatment Recommendations Defer until completion of intrumental exam     12/29/2021   9:26 AM Prognosis Prognosis for Safe Diet Advancement Fair Barriers to Reach Goals Cognitive deficits   12/29/2021   9:25 AM Diet Recommendations SLP Diet Recommendations Dysphagia 3 (Mech soft) solids;Thin liquid Liquid Administration via Cup;Straw Medication Administration Other (Comment) Compensations Slow rate;Small  sips/bites;Follow solids with liquid Postural Changes Remain semi-upright after after feeds/meals (Comment);Seated upright at 90 degrees     12/29/2021   9:25 AM Other Recommendations Oral Care Recommendations Oral care BID Follow Up Recommendations Follow physician's recommendations for discharge plan and follow up therapies Functional Status Assessment Patient has had a recent decline in their functional status and demonstrates the ability to make significant improvements in function in a reasonable and predictable amount of time.   12/29/2021   9:25 AM Frequency and Duration  Speech Therapy Frequency (ACUTE ONLY) min 2x/week Treatment Duration 1 week     12/29/2021   9:18 AM Oral Phase Oral Phase Impaired Oral - Nectar Teaspoon WFL Oral - Nectar Cup WFL Oral - Nectar Straw WFL Oral - Thin Teaspoon WFL Oral - Thin Cup WFL Oral - Thin Straw WFL Oral - Puree Decreased bolus cohesion;Lingual pumping;Delayed oral transit Oral - Mech Soft Decreased bolus cohesion Oral - Pill Decreased bolus cohesion    12/29/2021   9:20 AM Pharyngeal Phase Pharyngeal Phase Impaired Pharyngeal- Nectar Teaspoon Pharyngeal residue - valleculae Pharyngeal Material does not enter airway Pharyngeal- Nectar Cup Pharyngeal residue - valleculae Pharyngeal Material does not enter airway Pharyngeal- Nectar Straw Pharyngeal  residue - valleculae;Pharyngeal residue - pyriform Pharyngeal Material does not enter airway Pharyngeal- Thin Teaspoon Pharyngeal residue - valleculae Pharyngeal Material does not enter airway Pharyngeal- Thin Cup Pharyngeal residue - valleculae Pharyngeal Material does not enter airway Pharyngeal- Thin Straw Pharyngeal residue - valleculae Pharyngeal Material does not enter airway Pharyngeal- Puree Delayed swallow initiation-vallecula;Pharyngeal residue - valleculae;Pharyngeal residue - pyriform Pharyngeal Material does not enter airway Pharyngeal- Mechanical Soft Pharyngeal residue - valleculae Pharyngeal Material does not  enter airway Pharyngeal- Pill Pharyngeal residue - valleculae;Pharyngeal residue - pyriform Pharyngeal Material does not enter airway    12/29/2021   9:25 AM Cervical Esophageal Phase  Cervical Esophageal Phase Impaired Corey Lime, MS [facility] SLP Acute Rehab Services Office [phone] Pager [phone] Macario Golds 12/29/2021, 9:43 AM                     DG Chest Port 1 View  Result Date: 12/28/2021 CLINICAL DATA:  88 year old male with history of shortness of breath. EXAM: PORTABLE CHEST 1 VIEW COMPARISON:  Chest x-ray 12/26/2021. FINDINGS: Lung volumes are normal. No consolidative airspace disease. However, there is diffusely increased interstitial prominence and widespread peribronchial cuffing, which has worsened compared to the prior study. No pleural effusions. No pneumothorax. No pulmonary nodule or mass noted. Pulmonary vasculature and the cardiomediastinal silhouette are within normal limits. Atherosclerosis in the thoracic aorta. IMPRESSION: 1. The appearance the chest suggests progressive worsening severe bronchitis, as above. 2. Aortic atherosclerosis. Electronically Signed   By: Vinnie Langton M.D.   On: 12/28/2021 06:36   ECHOCARDIOGRAM COMPLETE  Result Date: 12/27/2021    ECHOCARDIOGRAM REPORT   Patient Name:   Corey Kelley Date of Exam: 12/27/2021 Medical Rec #:  [id number]    Height:       67.0 in Accession #:    [id number]   Weight:       179.0 lb Date of Birth:  1927/09/28    BSA:          1.929 m Patient Age:    38 years     BP:           129/76 mmHg Patient Gender: M            HR:           81 bpm. Exam Location:  Inpatient Procedure: 2D Echo Indications:    syncope  History:        Patient has no prior history of Echocardiogram examinations.                 Risk Factors:Hypertension.  Sonographer:    Harvie Junior Referring Phys: [place]  Sonographer Comments: Technically difficult study due to poor echo windows. Image acquisition challenging due to respiratory  motion. IMPRESSIONS  1. Left ventricular ejection fraction, by estimation, is 60 to 65%. The left ventricle has normal function. The left ventricle has no regional wall motion abnormalities. There is mild concentric left ventricular hypertrophy. Left ventricular diastolic parameters are consistent with Grade I diastolic dysfunction (impaired relaxation).  2. Right ventricular systolic function is normal. The right ventricular size is normal. There is normal pulmonary artery systolic pressure.  3. The mitral valve is grossly normal. Trivial mitral valve regurgitation. No evidence of mitral stenosis.  4. The aortic valve is tricuspid. There is moderate calcification of the aortic valve. There is moderate thickening of the aortic valve. Aortic valve regurgitation is not visualized. Aortic valve sclerosis/calcification is present, without any evidence of aortic stenosis. Comparison(s):  No prior Echocardiogram. FINDINGS  Left Ventricle: Left ventricular ejection fraction, by estimation, is 60 to 65%. The left ventricle has normal function. The left ventricle has no regional wall motion abnormalities. The left ventricular internal cavity size was normal in size. There is  mild concentric left ventricular hypertrophy. Left ventricular diastolic parameters are consistent with Grade I diastolic dysfunction (impaired relaxation). Right Ventricle: The right ventricular size is normal. Right vetricular wall thickness was not well visualized. Right ventricular systolic function is normal. There is normal pulmonary artery systolic pressure. The tricuspid regurgitant velocity is 2.40 m/s, and with an assumed right atrial pressure of 3 mmHg, the estimated right ventricular systolic pressure is 71.0 mmHg. Left Atrium: Left atrial size was normal in size. Right Atrium: Right atrial size was normal in size. Pericardium: There is no evidence of pericardial effusion. Mitral Valve: The mitral valve is grossly normal. There is mild  thickening of the mitral valve leaflet(s). There is mild calcification of the mitral valve leaflet(s). Trivial mitral valve regurgitation. No evidence of mitral valve stenosis. Tricuspid Valve: The tricuspid valve is normal in structure. Tricuspid valve regurgitation is trivial. Aortic Valve: The aortic valve is tricuspid. There is moderate calcification of the aortic valve. There is moderate thickening of the aortic valve. Aortic valve regurgitation is not visualized. Aortic valve sclerosis/calcification is present, without any  evidence of aortic stenosis. Aortic valve mean gradient measures 7.0 mmHg. Aortic valve peak gradient measures 10.5 mmHg. Aortic valve area, by VTI measures 2.12 cm. Pulmonic Valve: The pulmonic valve was normal in structure. Pulmonic valve regurgitation is mild. Aorta: The aortic root is normal in size and structure. Venous: The inferior vena cava was not well visualized. IAS/Shunts: The atrial septum is grossly normal.  LEFT VENTRICLE PLAX 2D LVIDd:         4.20 cm     Diastology LVIDs:         3.00 cm     LV e' medial:    6.64 cm/s LV PW:         1.20 cm     LV E/e' medial:  11.3 LV IVS:        1.20 cm     LV e' lateral:   7.29 cm/s LVOT diam:     2.10 cm     LV E/e' lateral: 10.3 LV SV:         71 LV SV Index:   37 LVOT Area:     3.46 cm  LV Volumes (MOD) LV vol d, MOD A2C: 92.4 ml LV vol d, MOD A4C: 83.8 ml LV vol s, MOD A2C: 42.2 ml LV vol s, MOD A4C: 37.7 ml LV SV MOD A2C:     50.2 ml LV SV MOD A4C:     83.8 ml LV SV MOD BP:      49.3 ml RIGHT VENTRICLE RV Basal diam:  4.10 cm RV Mid diam:    3.20 cm RV S prime:     11.70 cm/s TAPSE (M-mode): 2.1 cm LEFT ATRIUM           Index        RIGHT ATRIUM           Index LA diam:      3.50 cm 1.81 cm/m   RA Area:     16.10 cm LA Vol (A4C): 32.7 ml 16.95 ml/m  RA Volume:   38.60 ml  20.01 ml/m  AORTIC VALVE  PULMONIC VALVE AV Area (Vmax):    2.04 cm      PV Vmax:          0.76 m/s AV Area (Vmean):   2.00 cm      PV  Peak grad:     2.3 mmHg AV Area (VTI):     2.12 cm      PR End Diast Vel: 4.00 msec AV Vmax:           162.00 cm/s AV Vmean:          125.000 cm/s AV VTI:            0.335 m AV Peak Grad:      10.5 mmHg AV Mean Grad:      7.0 mmHg LVOT Vmax:         95.30 cm/s LVOT Vmean:        72.300 cm/s LVOT VTI:          0.205 m LVOT/AV VTI ratio: 0.61  AORTA Ao Root diam: 3.50 cm Ao Asc diam:  3.20 cm MITRAL VALVE               TRICUSPID VALVE MV Area (PHT): 3.21 cm    TR Peak grad:   23.0 mmHg MV Decel Time: 236 msec    TR Vmax:        240.00 cm/s MR Peak grad: 40.9 mmHg MR Vmax:      319.67 cm/s  SHUNTS MV E velocity: 75.00 cm/s  Systemic VTI:  0.20 m MV A velocity: 74.60 cm/s  Systemic Diam: 2.10 cm MV E/A ratio:  1.01 Gwyndolyn Kaufman MD Electronically signed by Gwyndolyn Kaufman MD Signature Date/Time: 12/27/2021/1:22:53 PM    Final      Medical Consultants:   None.   Subjective:    Corey Kelley no complaints today.  Objective:    Vitals:   12/28/21 2249 12/29/21 0615 12/29/21 0746 12/29/21 0932  BP: 121/67 (!) 140/79  (!) 148/76  Pulse: 83 88  86  Resp: '16 16  17  '$ Temp: (!) 97.1 F (36.2 C) 99.9 F (37.7 C)  97.6 F (36.4 C)  TempSrc: Oral Oral  Oral  SpO2: 95% 95% 98% 98%  Weight:      Height:       SpO2: 98 % O2 Flow Rate (L/min): 2 L/min FiO2 (%): 28 %   Intake/Output Summary (Last 24 hours) at 12/29/2021 1218 Last data filed at 12/29/2021 1159 Gross per 24 hour  Intake 1976.18 ml  Output 1250 ml  Net 726.18 ml    Filed Weights   12/27/21 2350  Weight: 91.4 kg    Exam: General exam: In no acute distress. Respiratory system: Good air movement and clear to auscultation. Cardiovascular system: S1 & S2 heard, RRR. No JVD. Gastrointestinal system: Abdomen is nondistended, soft and nontender.  Extremities: No pedal edema. Skin: No rashes, lesions or ulcers  Data Reviewed:    Labs: Basic Metabolic Panel: Recent Labs  Lab 12/26/21 0049 12/26/21 2102  12/26/21 2102 12/27/21 0510 12/28/21 0913 12/29/21 0550  NA  --  134*  --  135 133* 136  K  --  3.4*   < > 3.8 3.5 3.7  CL  --  101  --  100 102 106  CO2  --  25  --  '26 24 26  '$ GLUCOSE  --  112*  --  102* 101* 94  BUN  --  21  --  18 18 15  CREATININE  --  1.35*  --  1.14 1.02 0.91  CALCIUM  --  9.1  --  8.8* 8.2* 8.2*  MG 1.7  --   --  1.7  --   --   PHOS 3.0  --   --  2.9  --   --    < > = values in this interval not displayed.    GFR Estimated Creatinine Clearance: 55.8 mL/min (by C-G formula based on SCr of 0.91 mg/dL). Liver Function Tests: Recent Labs  Lab 12/26/21 2102 12/27/21 0510  AST 92* 105*  ALT 25 29  ALKPHOS 47 51  BILITOT 1.1 1.4*  PROT 6.8 7.3  ALBUMIN 3.7 3.9    No results for input(s): "LIPASE", "AMYLASE" in the last 168 hours. No results for input(s): "AMMONIA" in the last 168 hours. Coagulation profile Recent Labs  Lab 12/26/21 2102  INR 1.1    COVID-19 Labs  No results for input(s): "DDIMER", "FERRITIN", "LDH", "CRP" in the last 72 hours.   Lab Results  Component Value Date   SARSCOV2NAA NEGATIVE 12/26/2021   [place] NEGATIVE 01/26/2020    CBC: Recent Labs  Lab 12/26/21 2102 12/27/21 0510  WBC 7.9 7.7  NEUTROABS 6.5  --   HGB 16.2 16.2  HCT 47.3 47.3  MCV 96.7 96.3  PLT 163 146*    Cardiac Enzymes: Recent Labs  Lab 12/26/21 0049 12/28/21 0913  CKTOTAL 3,399* 1,607*    BNP (last 3 results) No results for input(s): "PROBNP" in the last 8760 hours. CBG: No results for input(s): "GLUCAP" in the last 168 hours. D-Dimer: No results for input(s): "DDIMER" in the last 72 hours.  Hgb A1c: No results for input(s): "HGBA1C" in the last 72 hours. Lipid Profile: No results for input(s): "CHOL", "HDL", "LDLCALC", "TRIG", "CHOLHDL", "LDLDIRECT" in the last 72 hours. Thyroid function studies: Recent Labs    12/27/21 0049  TSH 0.337*    Anemia work up: No results for input(s): "VITAMINB12", "FOLATE", "FERRITIN",  "TIBC", "IRON", "RETICCTPCT" in the last 72 hours. Sepsis Labs: Recent Labs  Lab 12/26/21 2102 12/27/21 0049 12/27/21 0510  WBC 7.9  --  7.7  LATICACIDVEN 1.8 1.4  --     Microbiology Recent Results (from the past 240 hour(s))  Resp panel by RT-PCR (RSV, Flu A&B, Covid) Anterior Nasal Swab     Status: Abnormal   Collection Time: 12/26/21  9:09 PM   Specimen: Anterior Nasal Swab  Result Value Ref Range Status   SARS Coronavirus 2 by RT PCR NEGATIVE NEGATIVE Final    Comment: (NOTE) SARS-CoV-2 target nucleic acids are NOT DETECTED.  The SARS-CoV-2 RNA is generally detectable in upper respiratory specimens during the acute phase of infection. The lowest concentration of SARS-CoV-2 viral copies this assay can detect is 138 copies/mL. A negative result does not preclude SARS-Cov-2 infection and should not be used as the sole basis for treatment or other patient management decisions. A negative result may occur with  improper specimen collection/handling, submission of specimen other than nasopharyngeal swab, presence of viral mutation(s) within the areas targeted by this assay, and inadequate number of viral copies(<138 copies/mL). A negative result must be combined with clinical observations, patient history, and epidemiological information. The expected result is Negative.  Fact Sheet for Patients:  EntrepreneurPulse.com.au  Fact Sheet for Healthcare Providers:  IncredibleEmployment.be  This test is no t yet approved or cleared by the [place] FDA and  has been authorized for detection and/or diagnosis of SARS-CoV-2 by FDA  under an Emergency Use Authorization (EUA). This EUA will remain  in effect (meaning this test can be used) for the duration of the COVID-19 declaration under Section 564(b)(1) of the Act, 21 U.S.C.section 360bbb-3(b)(1), unless the authorization is terminated  or revoked sooner.       Influenza A by PCR  POSITIVE (A) NEGATIVE Final   Influenza B by PCR NEGATIVE NEGATIVE Final    Comment: (NOTE) The Xpert Xpress SARS-CoV-2/FLU/RSV plus assay is intended as an aid in the diagnosis of influenza from Nasopharyngeal swab specimens and should not be used as a sole basis for treatment. Nasal washings and aspirates are unacceptable for Xpert Xpress SARS-CoV-2/FLU/RSV testing.  Fact Sheet for Patients: EntrepreneurPulse.com.au  Fact Sheet for Healthcare Providers: IncredibleEmployment.be  This test is not yet approved or cleared by the [place] FDA and has been authorized for detection and/or diagnosis of SARS-CoV-2 by FDA under an Emergency Use Authorization (EUA). This EUA will remain in effect (meaning this test can be used) for the duration of the COVID-19 declaration under Section 564(b)(1) of the Act, 21 U.S.C. section 360bbb-3(b)(1), unless the authorization is terminated or revoked.     Resp Syncytial Virus by PCR NEGATIVE NEGATIVE Final    Comment: (NOTE) Fact Sheet for Patients: EntrepreneurPulse.com.au  Fact Sheet for Healthcare Providers: IncredibleEmployment.be  This test is not yet approved or cleared by the [place] FDA and has been authorized for detection and/or diagnosis of SARS-CoV-2 by FDA under an Emergency Use Authorization (EUA). This EUA will remain in effect (meaning this test can be used) for the duration of the COVID-19 declaration under Section 564(b)(1) of the Act, 21 U.S.C. section 360bbb-3(b)(1), unless the authorization is terminated or revoked.  Performed at [facility], [place] [address] [place], [place] [address]   Urine Culture     Status: Abnormal   Collection Time: 12/27/21 12:23 AM   Specimen: In/Out Cath Urine  Result Value Ref Range Status   Specimen Description   Final    IN/OUT CATH URINE Performed at [facility] [address], [place], [place] [address]    Special Requests   Final    NONE Performed at [facility], [place] [address], [place], [place] [address]    Culture MULTIPLE SPECIES PRESENT, SUGGEST RECOLLECTION (A)  Final   Report Status 12/28/2021 FINAL  Final     Medications:    albuterol  3 mL Inhalation TID   Chlorhexidine Gluconate Cloth  6 each Topical Daily   enoxaparin (LOVENOX) injection  40 mg Subcutaneous Q24H   guaiFENesin  600 mg Oral BID   influenza vaccine adjuvanted  0.5 mL Intramuscular Tomorrow-1000   metoprolol tartrate  12.5 mg Oral BID   oseltamivir  30 mg Oral BID   tamsulosin  0.4 mg Oral QPC supper   Continuous Infusions:      LOS: 2 days   Charlynne Cousins  [facility]  12/29/2021, 12:18 PM

## 2021-12-29 NOTE — Progress Notes (Signed)
Modified Barium Swallow Progress Note  Patient Details  Name: Corey Kelley MRN: [id number] Date of Birth: 16-Jul-1927  Today's Date: 12/29/2021  Modified Barium Swallow completed.  Full report located under Chart Review in the Imaging Section.  Brief recommendations include the following:  Clinical Impression  Patient presents with mild oropharyngeal dysphagia with obstructive component due to appearance of cervical osteophyte impacting pharyngeal clearance resulting in mild pharyngeal residuals (that mix with secretions) across all consistencies.  Pt with impaired sensation to retention when minimal but conducts reflexive dry swallows to help decrease.   Following solid with liquids also helpful to decrease retention.  Prolonged oral transiting of pudding with lingual pumping noted due to mild weakness.   Barium tablet taken with thin momentarily stalled at valleculae and pyriform sinus, requiring several sequential boluses of thin to clear. Laryngeal penetration of thin when taking barium tablet observed, thus for safety, recommend medications be taken with puree- cut or crush if large and follow with liquids.  Patient educated to findings/recommendations.  Of note, his voice was occasional gurgly after MBS (when larynx was clear) - suspect some component of secretion aspiration.   Swallow Evaluation Recommendations       SLP Diet Recommendations: Dysphagia 3 (Mech soft) solids;Thin liquid   Liquid Administration via: Cup;Straw   Medication Administration: Other (Comment) (cut in half or crushed if not SMALL and give with puree)   Supervision: Patient able to self feed   Compensations: Slow rate;Small sips/bites;Follow solids with liquid    Postural Changes: Remain semi-upright after after feeds/meals (Comment);Seated upright at 90 degrees   Oral Care Recommendations: Oral care BID    Kathleen Lime, MS [facility] SLP Acute Rehab Services Office [phone] Pager [phone]   Macario Golds 12/29/2021,9:42 AM

## 2021-12-29 NOTE — TOC Progression Note (Signed)
Transition of Care [facility]) - Progression Note   Patient Details  Name: Rhythm Wigfall MRN: [id number] Date of Birth: 11-25-1927  Transition of Care [facility]) CM/SW [place], LCSW Phone Number: 12/29/2021, 3:54 PM  Clinical Narrative: CSW provided bed offers to family. Daughter expressed feeling rushed to chose a SNF. CSW explained the process and that insurance authorization would need to be obtained prior to the holiday to prevent the patient from staying in the hospital unnecessarily long. Daughter chose [facility] CSW confirmed bed with Kia in admissions.    Expected Discharge Plan: [place] Barriers to Discharge: Continued Medical Work up, SNF Pending bed offer, Ship broker  Expected Discharge Plan and Services In-house Referral: Clinical Social Work Post Acute Care Choice: [place] Living arrangements for the past 2 months: Single Family Home            DME Arranged: N/A DME Agency: NA  Social Determinants of Health (SDOH) Interventions SDOH Screenings   Tobacco Use: Medium Risk (12/27/2021)   Readmission Risk Interventions     No data to display

## 2021-12-30 DIAGNOSIS — N179 Acute kidney failure, unspecified: Secondary | ICD-10-CM | POA: Diagnosis not present

## 2021-12-30 DIAGNOSIS — N401 Enlarged prostate with lower urinary tract symptoms: Secondary | ICD-10-CM | POA: Diagnosis not present

## 2021-12-30 DIAGNOSIS — J101 Influenza due to other identified influenza virus with other respiratory manifestations: Secondary | ICD-10-CM | POA: Diagnosis not present

## 2021-12-30 LAB — BASIC METABOLIC PANEL
Anion gap: 6 (ref 5–15)
BUN: 13 mg/dL (ref 8–23)
CO2: 26 mmol/L (ref 22–32)
Calcium: 8.4 mg/dL — ABNORMAL LOW (ref 8.9–10.3)
Chloride: 107 mmol/L (ref 98–111)
Creatinine, Ser: 0.72 mg/dL (ref 0.61–1.24)
GFR, Estimated: >60 mL/min (ref >60)
Glucose, Bld: 100 mg/dL — ABNORMAL HIGH (ref 70–99)
Potassium: 3.6 mmol/L (ref 3.5–5.1)
Sodium: 139 mmol/L (ref 135–145)

## 2021-12-30 MED ORDER — ENOXAPARIN SODIUM 40 MG/0.4ML IJ SOSY
40.0000 mg | PREFILLED_SYRINGE | INTRAMUSCULAR | Status: DC
  Administered 2021-12-31: 40 mg via SUBCUTANEOUS
  Filled 2021-12-30: qty 0.4

## 2021-12-30 MED ORDER — ENSURE ENLIVE PO LIQD: 237.0000 mL | Freq: Two times a day (BID) | ORAL | 12 refills | Status: AC

## 2021-12-30 MED ORDER — LISINOPRIL 20 MG PO TABS: 10.0000 mg | ORAL_TABLET | Freq: Every day | ORAL | Status: AC

## 2021-12-30 MED ORDER — SODIUM CHLORIDE 0.9 % IV SOLN: INTRAVENOUS | Status: AC

## 2021-12-30 MED ORDER — TAMSULOSIN HCL 0.4 MG PO CAPS: 0.8000 mg | ORAL_CAPSULE | Freq: Every day | ORAL | Status: AC

## 2021-12-30 MED ORDER — OSELTAMIVIR PHOSPHATE 75 MG PO CAPS
75.0000 mg | ORAL_CAPSULE | Freq: Two times a day (BID) | ORAL | Status: DC
  Administered 2021-12-30 – 2021-12-31 (×3): 75 mg via ORAL
  Filled 2021-12-30 (×4): qty 1

## 2021-12-30 MED ORDER — OSELTAMIVIR PHOSPHATE 75 MG PO CAPS: 75.0000 mg | ORAL_CAPSULE | Freq: Two times a day (BID) | ORAL | 0 refills | Status: AC

## 2021-12-30 NOTE — Discharge Summary (Addendum)
Physician Discharge Summary  Kamori Barbier ZJQ:[id number] DOB: 02/21/1927 DOA: 12/26/2021  PCP: Lorene Dy, MD  Admit date: 12/26/2021 Discharge date: 12/30/2021  Admitted From: Home  Disposition:  SNF    Recommendations for Outpatient Follow-up:  Follow up with PCP in 1-2 weeks Please obtain BMP/CBC in one week   Home Health:No  Equipment/Devices:[place]   Discharge Condition:Guarded   CODE STATUS:Full  Diet recommendation: Heart Healthy   Brief/Interim Summary:  88 y.o. male past medical history significant for dementia, hypertension BPH comes in with falls started on the day of admission denies any prodromal symptoms except for coughing.,  Family relates he has had lower extremity edema right more than the left patient is unable to provide a history due to his dementia.  The family also relates that the wife has been sick   Discharge Diagnoses:  Principal Problem:   AKI (acute kidney injury) ([place]) Active Problems:   Essential hypertension   Debility   BPH (benign prostatic hyperplasia)   Influenza   Hypokalemia   Leg edema, right   SBO (small bowel obstruction) ([facility])   Acute urinary retention   Influenza A   Malnutrition of moderate degree  Acute kidney injury: Probably prerenal azotemia in the setting of ACE inhibitor Norvasc use and influenza A. Antihypertensive medications were held he was fluid resuscitated creatinine returned to baseline.  Physical therapy evaluated the patient recommended skilled nursing facility. Because he was pocketing his food and not even able to swallow speech evaluated the patient who deemed in high risk for aspiration risk and benefits were explained to the family and he seems to understand.  Rhabdomyolysis: Resolved with fluids resuscitation nontraumatic, likely due to dehydration.  Influenza A: With fluids irritated started on Tamiflu has appetite improved.  Essential hypertension: Antihypertensive medications were held  on admission will be resumed as an outpatient.  Hypokalemia: Replete orally now resolved.  Right lower extremity edema: Lower extremity Doppler was negative for DVT, there were no signs of infection.  Urinary retention/BPH: A Foley was inserted he was started on Flomax we will follow-up with urology as an outpatient and try to give him a voiding trial as an outpatient.  Advanced dementia: Currently on no medications, need to be reevaluated by PCP as an outpatient.   Discharge Instructions  Discharge Instructions     Diet - low sodium heart healthy   Complete by: As directed    Increase activity slowly   Complete by: As directed       Allergies as of 12/30/2021   No Known Allergies      Medication List     STOP taking these medications    amLODipine 5 MG tablet Commonly known as: NORVASC   traMADol 50 MG tablet Commonly known as: ULTRAM       TAKE these medications    cholecalciferol 25 MCG (1000 UNIT) tablet Commonly known as: VITAMIN D3 Take 1,000 Units by mouth daily.   Clotrimazole AF 1 % cream Generic drug: clotrimazole Apply 1 Application topically daily. To feet   feeding supplement Liqd Take 237 mLs by mouth 2 (two) times daily between meals.   lactulose 10 GM/15ML solution Commonly known as: CHRONULAC Take 20 g by mouth See admin instructions. On Mon, Wed, Friday if needed for constipation   latanoprost 0.005 % ophthalmic solution Commonly known as: XALATAN Place 1 drop into both eyes at bedtime.   lisinopril 20 MG tablet Commonly known as: ZESTRIL Take 0.5 tablets (10 mg total) by mouth daily.  Start taking on: January 04, 2022 What changed:  how much to take These instructions start on January 04, 2022. If you are unsure what to do until then, ask your doctor or other care provider.   lovastatin 20 MG tablet Commonly known as: MEVACOR Take 20 mg by mouth daily at 6 PM.   oseltamivir 75 MG capsule Commonly known as: TAMIFLU Take 1  capsule (75 mg total) by mouth 2 (two) times daily for 2 days.   tamsulosin 0.4 MG Caps capsule Commonly known as: FLOMAX Take 2 capsules (0.8 mg total) by mouth daily after breakfast. What changed:  how much to take when to take this        Contact information for after-discharge care     Destination     [facility] Preferred SNF .   Service: Skilled Nursing Contact information: [address] [place] [phone]                    No Known Allergies  Consultations: None   Procedures/Studies: DG Swallowing Func-Speech Pathology  Result Date: 12/29/2021 Table formatting from the original result was not included. Objective Swallowing Evaluation: Type of Study: Bedside Swallow Evaluation  Patient Details Name: Tabias Swayze MRN: [id number] Date of Birth: [birth year] Today's Date: 12/29/2021 Time: SLP Start Time (ACUTE ONLY): 0854 -SLP Stop Time (ACUTE ONLY): 0913 SLP Time Calculation (min) (ACUTE ONLY): 19 min Past Medical History: Past Medical History: Diagnosis Date  Hypercholesteremia   Hypertension   Prostate nodule  Past Surgical History: Past Surgical History: Procedure Laterality Date  HERNIA REPAIR  1950's HPI: Masaru Chamberlin is a 88 y.o. male with medical history significant of Dementia, HTN, HLD, BPH        Presented with  falls, fatigue  Pt have had 3 falls today had to call EMS to get him up from the ground  Not sure if he hit his head  BP 146/72  Denies CP , SOB or fever but has been coughing    Has a right leg swelling    Family feels that both legs have been swollen for months but right more than left   It has been interfering with his walking   Pt unable to provide hx   But no fever no diarrhea   He used to smoke but no any more does not drink  His wife has been sick recently as well; 12/28 CXR indicated The appearance the chest suggests progressive worsening severe  bronchitis, as above.  Nursing informed SLP pt "coughing  with liquids and pocketing food" and requiring breathing tx from RT.  BSE generated.  Subjective: pt awake in chair  Recommendations for follow up therapy are one component of a multi-disciplinary discharge planning process, led by the attending physician.  Recommendations may be updated based on patient status, additional functional criteria and insurance authorization. Assessment / Plan / Recommendation   12/29/2021   9:25 AM Clinical Impressions Clinical Impression Patient presents with mild oropharyngeal dysphagia with obstructive component due to appearance of cervical osteophyte impacting pharyngeal clearance resulting in mild pharyngeal residuals (that mix with secretions) across all consistencies.  Pt with impaired sensation to retention when minimal but conducts reflexive dry swallows to help decrease.   Following solid with liquids also helpful to decrease retention.  Prolonged oral transiting of pudding with lingual pumping noted due to mild weakness.   Barium tablet taken with thin momentarily stalled at valleculae and pyriform sinus, requiring several sequential  boluses of thin to clear. Laryngeal penetration of thin when taking barium tablet observed, thus for safety, recommend medications be taken with puree- cut or crush if large and follow with liquids.  Patient educated to findings, recommendations.  Of note, his voice was occasional gurgly after MBS (when larynx was clear) - suspect some component of secretion aspiration. SLP Visit Diagnosis Dysphagia, oropharyngeal phase (R13.12) Impact on safety and function Moderate aspiration risk     12/29/2021   9:25 AM Treatment Recommendations Treatment Recommendations Defer until completion of intrumental exam     12/29/2021   9:26 AM Prognosis Prognosis for Safe Diet Advancement Fair Barriers to Reach Goals Cognitive deficits   12/29/2021   9:25 AM Diet Recommendations SLP Diet Recommendations Dysphagia 3 (Mech soft) solids;Thin liquid Liquid  Administration via Cup;Straw Medication Administration Other (Comment) Compensations Slow rate;Small sips/bites;Follow solids with liquid Postural Changes Remain semi-upright after after feeds/meals (Comment);Seated upright at 90 degrees     12/29/2021   9:25 AM Other Recommendations Oral Care Recommendations Oral care BID Follow Up Recommendations Follow physician's recommendations for discharge plan and follow up therapies Functional Status Assessment Patient has had a recent decline in their functional status and demonstrates the ability to make significant improvements in function in a reasonable and predictable amount of time.   12/29/2021   9:25 AM Frequency and Duration  Speech Therapy Frequency (ACUTE ONLY) min 2x/week Treatment Duration 1 week     12/29/2021   9:18 AM Oral Phase Oral Phase Impaired Oral - Nectar Teaspoon WFL Oral - Nectar Cup WFL Oral - Nectar Straw WFL Oral - Thin Teaspoon WFL Oral - Thin Cup WFL Oral - Thin Straw WFL Oral - Puree Decreased bolus cohesion;Lingual pumping;Delayed oral transit Oral - Mech Soft Decreased bolus cohesion Oral - Pill Decreased bolus cohesion    12/29/2021   9:20 AM Pharyngeal Phase Pharyngeal Phase Impaired Pharyngeal- Nectar Teaspoon Pharyngeal residue - valleculae Pharyngeal Material does not enter airway Pharyngeal- Nectar Cup Pharyngeal residue - valleculae Pharyngeal Material does not enter airway Pharyngeal- Nectar Straw Pharyngeal residue - valleculae;Pharyngeal residue - pyriform Pharyngeal Material does not enter airway Pharyngeal- Thin Teaspoon Pharyngeal residue - valleculae Pharyngeal Material does not enter airway Pharyngeal- Thin Cup Pharyngeal residue - valleculae Pharyngeal Material does not enter airway Pharyngeal- Thin Straw Pharyngeal residue - valleculae Pharyngeal Material does not enter airway Pharyngeal- Puree Delayed swallow initiation-vallecula;Pharyngeal residue - valleculae;Pharyngeal residue - pyriform Pharyngeal Material does not  enter airway Pharyngeal- Mechanical Soft Pharyngeal residue - valleculae Pharyngeal Material does not enter airway Pharyngeal- Pill Pharyngeal residue - valleculae;Pharyngeal residue - pyriform Pharyngeal Material does not enter airway    12/29/2021   9:25 AM Cervical Esophageal Phase  Cervical Esophageal Phase Impaired Kathleen Lime, MS [facility] SLP Acute Rehab Services Office [phone] Pager [phone] Macario Golds 12/29/2021, 9:43 AM                     DG Chest Port 1 View  Result Date: 12/28/2021 CLINICAL DATA:  88 year old male with history of shortness of breath. EXAM: PORTABLE CHEST 1 VIEW COMPARISON:  Chest x-ray 12/26/2021. FINDINGS: Lung volumes are normal. No consolidative airspace disease. However, there is diffusely increased interstitial prominence and widespread peribronchial cuffing, which has worsened compared to the prior study. No pleural effusions. No pneumothorax. No pulmonary nodule or mass noted. Pulmonary vasculature and the cardiomediastinal silhouette are within normal limits. Atherosclerosis in the thoracic aorta. IMPRESSION: 1. The appearance the chest suggests progressive worsening severe bronchitis, as  above. 2. Aortic atherosclerosis. Electronically Signed   By: Vinnie Langton M.D.   On: 12/28/2021 06:36   VAS [place] LOWER EXTREMITY VENOUS (DVT)  Result Date: 12/27/2021  Lower Venous DVT Study Patient Name:  ILIJAH DOUCET  Date of Exam:   12/27/2021 Medical Rec #: [id number]     Accession #:    [id number] Date of Birth: Apr 27, 1927     Patient Gender: M Patient Age:   70 years Exam Location:  [facility] Procedure:      VAS [place] LOWER EXTREMITY VENOUS (DVT) Referring Phys: Nyoka Lint DOUTOVA --------------------------------------------------------------------------------  Indications: Swelling.  Comparison Study: No previous exams Performing Technologist: Jody Hill RVT, RDMS  Examination Guidelines: A complete evaluation includes B-mode imaging, spectral Doppler, color  Doppler, and power Doppler as needed of all accessible portions of each vessel. Bilateral testing is considered an integral part of a complete examination. Limited examinations for reoccurring indications may be performed as noted. The reflux portion of the exam is performed with the patient in reverse Trendelenburg.  +---------+---------------+---------+-----------+----------+--------------+ RIGHT    CompressibilityPhasicitySpontaneityPropertiesThrombus Aging +---------+---------------+---------+-----------+----------+--------------+ CFV      Full           Yes      Yes                                 +---------+---------------+---------+-----------+----------+--------------+ SFJ      Full                                                        +---------+---------------+---------+-----------+----------+--------------+ FV Prox  Full           Yes      Yes                                 +---------+---------------+---------+-----------+----------+--------------+ FV Mid   Full           Yes      Yes                                 +---------+---------------+---------+-----------+----------+--------------+ FV DistalFull           Yes      Yes                                 +---------+---------------+---------+-----------+----------+--------------+ PFV      Full                                                        +---------+---------------+---------+-----------+----------+--------------+ POP      Full           Yes      Yes                                 +---------+---------------+---------+-----------+----------+--------------+ PTV      Full                                                        +---------+---------------+---------+-----------+----------+--------------+  PERO     Full                                                        +---------+---------------+---------+-----------+----------+--------------+    +----+---------------+---------+-----------+----------+--------------+ LEFTCompressibilityPhasicitySpontaneityPropertiesThrombus Aging +----+---------------+---------+-----------+----------+--------------+ CFV Full           Yes      Yes                                 +----+---------------+---------+-----------+----------+--------------+     Summary: RIGHT: - There is no evidence of deep vein thrombosis in the lower extremity.  - No cystic structure found in the popliteal fossa. Subcutaneous edema is area of calf and ankle.  LEFT: - No evidence of common femoral vein obstruction.  *See table(s) above for measurements and observations. Electronically signed by Deitra Mayo MD on 12/27/2021 at 2:31:10 PM.    Final    ECHOCARDIOGRAM COMPLETE  Result Date: 12/27/2021    ECHOCARDIOGRAM REPORT   Patient Name:   JADIER ROCKERS Date of Exam: 12/27/2021 Medical Rec #:  [id number]    Height:       67.0 in Accession #:    [id number]   Weight:       179.0 lb Date of Birth:  27-Oct-1927    BSA:          1.929 m Patient Age:    17 years     BP:           129/76 mmHg Patient Gender: M            HR:           81 bpm. Exam Location:  Inpatient Procedure: 2D Echo Indications:    syncope  History:        Patient has no prior history of Echocardiogram examinations.                 Risk Factors:Hypertension.  Sonographer:    Harvie Junior Referring Phys: [place]  Sonographer Comments: Technically difficult study due to poor echo windows. Image acquisition challenging due to respiratory motion. IMPRESSIONS  1. Left ventricular ejection fraction, by estimation, is 60 to 65%. The left ventricle has normal function. The left ventricle has no regional wall motion abnormalities. There is mild concentric left ventricular hypertrophy. Left ventricular diastolic parameters are consistent with Grade I diastolic dysfunction (impaired relaxation).  2. Right ventricular systolic function is normal. The right  ventricular size is normal. There is normal pulmonary artery systolic pressure.  3. The mitral valve is grossly normal. Trivial mitral valve regurgitation. No evidence of mitral stenosis.  4. The aortic valve is tricuspid. There is moderate calcification of the aortic valve. There is moderate thickening of the aortic valve. Aortic valve regurgitation is not visualized. Aortic valve sclerosis/calcification is present, without any evidence of aortic stenosis. Comparison(s): No prior Echocardiogram. FINDINGS  Left Ventricle: Left ventricular ejection fraction, by estimation, is 60 to 65%. The left ventricle has normal function. The left ventricle has no regional wall motion abnormalities. The left ventricular internal cavity size was normal in size. There is  mild concentric left ventricular hypertrophy. Left ventricular diastolic parameters are consistent with Grade I diastolic dysfunction (impaired relaxation). Right Ventricle: The right ventricular size is normal. Right vetricular  wall thickness was not well visualized. Right ventricular systolic function is normal. There is normal pulmonary artery systolic pressure. The tricuspid regurgitant velocity is 2.40 m/s, and with an assumed right atrial pressure of 3 mmHg, the estimated right ventricular systolic pressure is 98.2 mmHg. Left Atrium: Left atrial size was normal in size. Right Atrium: Right atrial size was normal in size. Pericardium: There is no evidence of pericardial effusion. Mitral Valve: The mitral valve is grossly normal. There is mild thickening of the mitral valve leaflet(s). There is mild calcification of the mitral valve leaflet(s). Trivial mitral valve regurgitation. No evidence of mitral valve stenosis. Tricuspid Valve: The tricuspid valve is normal in structure. Tricuspid valve regurgitation is trivial. Aortic Valve: The aortic valve is tricuspid. There is moderate calcification of the aortic valve. There is moderate thickening of the aortic  valve. Aortic valve regurgitation is not visualized. Aortic valve sclerosis/calcification is present, without any  evidence of aortic stenosis. Aortic valve mean gradient measures 7.0 mmHg. Aortic valve peak gradient measures 10.5 mmHg. Aortic valve area, by VTI measures 2.12 cm. Pulmonic Valve: The pulmonic valve was normal in structure. Pulmonic valve regurgitation is mild. Aorta: The aortic root is normal in size and structure. Venous: The inferior vena cava was not well visualized. IAS/Shunts: The atrial septum is grossly normal.  LEFT VENTRICLE PLAX 2D LVIDd:         4.20 cm     Diastology LVIDs:         3.00 cm     LV e' medial:    6.64 cm/s LV PW:         1.20 cm     LV E/e' medial:  11.3 LV IVS:        1.20 cm     LV e' lateral:   7.29 cm/s LVOT diam:     2.10 cm     LV E/e' lateral: 10.3 LV SV:         71 LV SV Index:   37 LVOT Area:     3.46 cm  LV Volumes (MOD) LV vol d, MOD A2C: 92.4 ml LV vol d, MOD A4C: 83.8 ml LV vol s, MOD A2C: 42.2 ml LV vol s, MOD A4C: 37.7 ml LV SV MOD A2C:     50.2 ml LV SV MOD A4C:     83.8 ml LV SV MOD BP:      49.3 ml RIGHT VENTRICLE RV Basal diam:  4.10 cm RV Mid diam:    3.20 cm RV S prime:     11.70 cm/s TAPSE (M-mode): 2.1 cm LEFT ATRIUM           Index        RIGHT ATRIUM           Index LA diam:      3.50 cm 1.81 cm/m   RA Area:     16.10 cm LA Vol (A4C): 32.7 ml 16.95 ml/m  RA Volume:   38.60 ml  20.01 ml/m  AORTIC VALVE                     PULMONIC VALVE AV Area (Vmax):    2.04 cm      PV Vmax:          0.76 m/s AV Area (Vmean):   2.00 cm      PV Peak grad:     2.3 mmHg AV Area (VTI):     2.12 cm  PR End Diast Vel: 4.00 msec AV Vmax:           162.00 cm/s AV Vmean:          125.000 cm/s AV VTI:            0.335 m AV Peak Grad:      10.5 mmHg AV Mean Grad:      7.0 mmHg LVOT Vmax:         95.30 cm/s LVOT Vmean:        72.300 cm/s LVOT VTI:          0.205 m LVOT/AV VTI ratio: 0.61  AORTA Ao Root diam: 3.50 cm Ao Asc diam:  3.20 cm MITRAL VALVE                TRICUSPID VALVE MV Area (PHT): 3.21 cm    TR Peak grad:   23.0 mmHg MV Decel Time: 236 msec    TR Vmax:        240.00 cm/s MR Peak grad: 40.9 mmHg MR Vmax:      319.67 cm/s  SHUNTS MV E velocity: 75.00 cm/s  Systemic VTI:  0.20 m MV A velocity: 74.60 cm/s  Systemic Diam: 2.10 cm MV E/A ratio:  1.01 Gwyndolyn Kaufman MD Electronically signed by Gwyndolyn Kaufman MD Signature Date/Time: 12/27/2021/1:22:53 PM    Final    CT Angio Chest Pulmonary Embolism (PE) W or WO Contrast  Result Date: 12/27/2021 CLINICAL DATA:  Increasing weakness and elevated D-dimer. Pulmonary embolism suspected. Bowel obstruction suspected. EXAM: CT ANGIOGRAPHY CHEST CT ABDOMEN AND PELVIS WITH CONTRAST TECHNIQUE: Multidetector CT imaging of the chest was performed using the standard protocol during bolus administration of intravenous contrast. Multiplanar CT image reconstructions and MIPs were obtained to evaluate the vascular anatomy. Multidetector CT imaging of the abdomen and pelvis was performed using the standard protocol during bolus administration of intravenous contrast. RADIATION DOSE REDUCTION: This exam was performed according to the departmental dose-optimization program which includes automated exposure control, adjustment of the mA and/or kV according to patient size and/or use of iterative reconstruction technique. CONTRAST:  18m OMNIPAQUE IOHEXOL 350 MG/ML SOLN COMPARISON:  Radiographs 12/26/2021 and MRI abdomen 11/07/2021 and CT abdomen and pelvis 01/26/2020 FINDINGS: CTA CHEST FINDINGS Cardiovascular: Satisfactory opacification of the central pulmonary arteries. Poor opacification of the segmental pulmonary arteries. No central pulmonary embolism. Mild cardiomegaly. No pericardial effusion. Coronary artery and aortic atherosclerotic calcification. Mediastinum/Nodes: No thoracic adenopathy by size. Unremarkable esophagus. Lungs/Pleura: Expiratory phase exam. Diffuse mild bronchial wall thickening. Scattered  atelectasis/scarring in the lung bases. No pleural effusion or pneumothorax. Musculoskeletal: No chest wall abnormality. Irregular area of sclerosis in the superior anterior T8 vertebral body likely a benign bone island. No destructive change. Review of the MIP images confirms the above findings. CT ABDOMEN and PELVIS FINDINGS Hepatobiliary: No destructive change. Unchanged benign cysts or hemangiomas in the liver. Unremarkable gallbladder. No biliary dilation. Pancreas: Unremarkable. Spleen: Unremarkable. Adrenals/Urinary Tract: Normal adrenal glands. Complex cystic lesions in the lower pole of the right kidney and mid posterior left kidney where previously evaluated with MRI on 11/07/2021. See that report for recommendations. Urinary calculi or hydronephrosis. Foley catheter and gas within the decompressed bladder. Stomach/Bowel: Large stool ball in the rectum. Normal caliber large and small bowel. No bowel wall thickening. Unremarkable stomach. Vascular/Lymphatic: Aortic atherosclerosis. Similar ectasia of the infrarenal abdominal aorta measuring 2.9 cm. No enlarged abdominal or pelvic lymph nodes. Reproductive: Enlarged prostate. Other: No free intraperitoneal fluid or air. Small fat  containing bilateral inguinal hernias. Musculoskeletal: Similar sclerosis in the right superior pubic ramus. No acute osseous abnormality. Advanced degenerative change in the lower lumbar spine. Review of the MIP images confirms the above findings. IMPRESSION: 1. No central pulmonary embolism. The segmental arteries are not well opacified. 2. Mild bronchial wall thickening. 3. Coronary artery and Aortic Atherosclerosis (ICD10-I70.0). 1. No acute abnormality in the abdomen or pelvis. 2. Complex cystic lesions in the lower pole of the right kidney and mid posterior left kidney where previously evaluated with MRI on 11/07/2021. See that report for recommendations. Electronically Signed   By: Placido Sou M.D.   On: 12/27/2021 02:34    CT ABDOMEN PELVIS W CONTRAST  Result Date: 12/27/2021 CLINICAL DATA:  Increasing weakness and elevated D-dimer. Pulmonary embolism suspected. Bowel obstruction suspected. EXAM: CT ANGIOGRAPHY CHEST CT ABDOMEN AND PELVIS WITH CONTRAST TECHNIQUE: Multidetector CT imaging of the chest was performed using the standard protocol during bolus administration of intravenous contrast. Multiplanar CT image reconstructions and MIPs were obtained to evaluate the vascular anatomy. Multidetector CT imaging of the abdomen and pelvis was performed using the standard protocol during bolus administration of intravenous contrast. RADIATION DOSE REDUCTION: This exam was performed according to the departmental dose-optimization program which includes automated exposure control, adjustment of the mA and/or kV according to patient size and/or use of iterative reconstruction technique. CONTRAST:  33m OMNIPAQUE IOHEXOL 350 MG/ML SOLN COMPARISON:  Radiographs 12/26/2021 and MRI abdomen 11/07/2021 and CT abdomen and pelvis 01/26/2020 FINDINGS: CTA CHEST FINDINGS Cardiovascular: Satisfactory opacification of the central pulmonary arteries. Poor opacification of the segmental pulmonary arteries. No central pulmonary embolism. Mild cardiomegaly. No pericardial effusion. Coronary artery and aortic atherosclerotic calcification. Mediastinum/Nodes: No thoracic adenopathy by size. Unremarkable esophagus. Lungs/Pleura: Expiratory phase exam. Diffuse mild bronchial wall thickening. Scattered atelectasis/scarring in the lung bases. No pleural effusion or pneumothorax. Musculoskeletal: No chest wall abnormality. Irregular area of sclerosis in the superior anterior T8 vertebral body likely a benign bone island. No destructive change. Review of the MIP images confirms the above findings. CT ABDOMEN and PELVIS FINDINGS Hepatobiliary: No destructive change. Unchanged benign cysts or hemangiomas in the liver. Unremarkable gallbladder. No biliary  dilation. Pancreas: Unremarkable. Spleen: Unremarkable. Adrenals/Urinary Tract: Normal adrenal glands. Complex cystic lesions in the lower pole of the right kidney and mid posterior left kidney where previously evaluated with MRI on 11/07/2021. See that report for recommendations. Urinary calculi or hydronephrosis. Foley catheter and gas within the decompressed bladder. Stomach/Bowel: Large stool ball in the rectum. Normal caliber large and small bowel. No bowel wall thickening. Unremarkable stomach. Vascular/Lymphatic: Aortic atherosclerosis. Similar ectasia of the infrarenal abdominal aorta measuring 2.9 cm. No enlarged abdominal or pelvic lymph nodes. Reproductive: Enlarged prostate. Other: No free intraperitoneal fluid or air. Small fat containing bilateral inguinal hernias. Musculoskeletal: Similar sclerosis in the right superior pubic ramus. No acute osseous abnormality. Advanced degenerative change in the lower lumbar spine. Review of the MIP images confirms the above findings. IMPRESSION: 1. No central pulmonary embolism. The segmental arteries are not well opacified. 2. Mild bronchial wall thickening. 3. Coronary artery and Aortic Atherosclerosis (ICD10-I70.0). 1. No acute abnormality in the abdomen or pelvis. 2. Complex cystic lesions in the lower pole of the right kidney and mid posterior left kidney where previously evaluated with MRI on 11/07/2021. See that report for recommendations. Electronically Signed   By: TPlacido SouM.D.   On: 12/27/2021 02:34   DG Abd 1 View  Result Date: 12/26/2021 CLINICAL DATA:  Weakness.  Constipation  EXAM: ABDOMEN - 1 VIEW COMPARISON:  CT abdomen and pelvis 01/26/2020 FINDINGS: Prominent gas-filled loops of small and large bowel without definite dilation. Exam is limited by overlapping structures in the pelvis. No definite acute osseous abnormality. IMPRESSION: Prominent gas-filled loops of small and large bowel. If there is concern for obstruction CT is  recommended for further evaluation. Electronically Signed   By: Placido Sou M.D.   On: 12/26/2021 23:50   CT Head Wo Contrast  Result Date: 12/26/2021 CLINICAL DATA:  Recent falls EXAM: CT HEAD WITHOUT CONTRAST TECHNIQUE: Contiguous axial images were obtained from the base of the skull through the vertex without intravenous contrast. RADIATION DOSE REDUCTION: This exam was performed according to the departmental dose-optimization program which includes automated exposure control, adjustment of the mA and/or kV according to patient size and/or use of iterative reconstruction technique. COMPARISON:  01/26/2020 FINDINGS: Brain: No evidence of acute infarction, hemorrhage, hydrocephalus, extra-axial collection or mass lesion/mass effect. Chronic atrophic and ischemic changes are noted. Vascular: No hyperdense vessel or unexpected calcification. Skull: Normal. Negative for fracture or focal lesion. Sinuses/Orbits: No acute finding. Other: None. IMPRESSION: Chronic atrophic and ischemic changes without acute abnormality. Electronically Signed   By: Inez Catalina M.D.   On: 12/26/2021 22:09   DG Tibia/Fibula Right  Result Date: 12/26/2021 CLINICAL DATA:  Fall and trauma to the right lower extremity. EXAM: RIGHT TIBIA AND FIBULA - 2 VIEW; RIGHT FEMUR 2 VIEWS; RIGHT FOOT COMPLETE - 3+ VIEW COMPARISON:  Right hip radiograph dated 01/26/2020. FINDINGS: Evaluation is limited due to body habitus and soft tissue attenuation. There is no acute fracture or dislocation. There is moderate arthritic changes of the right knee. There is diffuse subcutaneous edema and soft tissue swelling of the dorsum of the foot. No radiopaque foreign object or soft tissue gas. IMPRESSION: 1. No acute fracture or dislocation. 2. Moderate arthritic changes of the right knee. 3. Diffuse subcutaneous edema and soft tissue swelling of the dorsum of the foot. Electronically Signed   By: Anner Crete M.D.   On: 12/26/2021 22:07   DG Foot  Complete Right  Result Date: 12/26/2021 CLINICAL DATA:  Fall and trauma to the right lower extremity. EXAM: RIGHT TIBIA AND FIBULA - 2 VIEW; RIGHT FEMUR 2 VIEWS; RIGHT FOOT COMPLETE - 3+ VIEW COMPARISON:  Right hip radiograph dated 01/26/2020. FINDINGS: Evaluation is limited due to body habitus and soft tissue attenuation. There is no acute fracture or dislocation. There is moderate arthritic changes of the right knee. There is diffuse subcutaneous edema and soft tissue swelling of the dorsum of the foot. No radiopaque foreign object or soft tissue gas. IMPRESSION: 1. No acute fracture or dislocation. 2. Moderate arthritic changes of the right knee. 3. Diffuse subcutaneous edema and soft tissue swelling of the dorsum of the foot. Electronically Signed   By: Anner Crete M.D.   On: 12/26/2021 22:07   DG Femur Min 2 Views Right  Result Date: 12/26/2021 CLINICAL DATA:  Fall and trauma to the right lower extremity. EXAM: RIGHT TIBIA AND FIBULA - 2 VIEW; RIGHT FEMUR 2 VIEWS; RIGHT FOOT COMPLETE - 3+ VIEW COMPARISON:  Right hip radiograph dated 01/26/2020. FINDINGS: Evaluation is limited due to body habitus and soft tissue attenuation. There is no acute fracture or dislocation. There is moderate arthritic changes of the right knee. There is diffuse subcutaneous edema and soft tissue swelling of the dorsum of the foot. No radiopaque foreign object or soft tissue gas. IMPRESSION: 1. No acute fracture or  dislocation. 2. Moderate arthritic changes of the right knee. 3. Diffuse subcutaneous edema and soft tissue swelling of the dorsum of the foot. Electronically Signed   By: Anner Crete M.D.   On: 12/26/2021 22:07   DG Chest Port 1 View  Result Date: 12/26/2021 CLINICAL DATA:  Weakness. Questionable sepsis evaluate for abnormality EXAM: PORTABLE CHEST 1 VIEW COMPARISON:  08/11/2013 FINDINGS: Low lung volumes. Stable cardiomediastinal silhouette. Aortic atherosclerotic calcification. Bibasilar atelectasis.  Unchanged chronic bronchitic thickening. No displaced rib fractures. IMPRESSION: Unchanged chronic bronchitic thickening. Electronically Signed   By: Placido Sou M.D.   On: 12/26/2021 21:01   (Echo, Carotid, EGD, Colonoscopy, ERCP)    Subjective:  No complains Discharge Exam: Vitals:   12/30/21 1329 12/30/21 1432  BP: 138/79   Pulse: 79   Resp:    Temp: (!) 97.4 F (36.3 C)   SpO2: 94% 96%   Vitals:   12/30/21 0602 12/30/21 0830 12/30/21 1329 12/30/21 1432  BP: 115/64  138/79   Pulse: 73  79   Resp: 20     Temp: 98.3 F (36.8 C)  (!) 97.4 F (36.3 C)   TempSrc: Oral  Oral   SpO2: 93% 94% 94% 96%  Weight:      Height:        General: Pt is alert, awake, not in acute distress Cardiovascular: RRR, S1/S2 +, no rubs, no gallops Respiratory: CTA bilaterally, no wheezing, no rhonchi Abdominal: Soft, NT, ND, bowel sounds + Extremities: no edema, no cyanosis    The results of significant diagnostics from this hospitalization (including imaging, microbiology, ancillary and laboratory) are listed below for reference.     Microbiology: Recent Results (from the past 240 hour(s))  Resp panel by RT-PCR (RSV, Flu A&B, Covid) Anterior Nasal Swab     Status: Abnormal   Collection Time: 12/26/21  9:09 PM   Specimen: Anterior Nasal Swab  Result Value Ref Range Status   SARS Coronavirus 2 by RT PCR NEGATIVE NEGATIVE Final    Comment: (NOTE) SARS-CoV-2 target nucleic acids are NOT DETECTED.  The SARS-CoV-2 RNA is generally detectable in upper respiratory specimens during the acute phase of infection. The lowest concentration of SARS-CoV-2 viral copies this assay can detect is 138 copies/mL. A negative result does not preclude SARS-Cov-2 infection and should not be used as the sole basis for treatment or other patient management decisions. A negative result may occur with  improper specimen collection/handling, submission of specimen other than nasopharyngeal swab, presence of  viral mutation(s) within the areas targeted by this assay, and inadequate number of viral copies(<138 copies/mL). A negative result must be combined with clinical observations, patient history, and epidemiological information. The expected result is Negative.  Fact Sheet for Patients:  EntrepreneurPulse.com.au  Fact Sheet for Healthcare Providers:  IncredibleEmployment.be  This test is no t yet approved or cleared by the [place] FDA and  has been authorized for detection and/or diagnosis of SARS-CoV-2 by FDA under an Emergency Use Authorization (EUA). This EUA will remain  in effect (meaning this test can be used) for the duration of the COVID-19 declaration under Section 564(b)(1) of the Act, 21 U.S.C.section 360bbb-3(b)(1), unless the authorization is terminated  or revoked sooner.       Influenza A by PCR POSITIVE (A) NEGATIVE Final   Influenza B by PCR NEGATIVE NEGATIVE Final    Comment: (NOTE) The Xpert Xpress SARS-CoV-2/FLU/RSV plus assay is intended as an aid in the diagnosis of influenza from Nasopharyngeal swab specimens and  should not be used as a sole basis for treatment. Nasal washings and aspirates are unacceptable for Xpert Xpress SARS-CoV-2/FLU/RSV testing.  Fact Sheet for Patients: EntrepreneurPulse.com.au  Fact Sheet for Healthcare Providers: IncredibleEmployment.be  This test is not yet approved or cleared by the [place] FDA and has been authorized for detection and/or diagnosis of SARS-CoV-2 by FDA under an Emergency Use Authorization (EUA). This EUA will remain in effect (meaning this test can be used) for the duration of the COVID-19 declaration under Section 564(b)(1) of the Act, 21 U.S.C. section 360bbb-3(b)(1), unless the authorization is terminated or revoked.     Resp Syncytial Virus by PCR NEGATIVE NEGATIVE Final    Comment: (NOTE) Fact Sheet for  Patients: EntrepreneurPulse.com.au  Fact Sheet for Healthcare Providers: IncredibleEmployment.be  This test is not yet approved or cleared by the [place] FDA and has been authorized for detection and/or diagnosis of SARS-CoV-2 by FDA under an Emergency Use Authorization (EUA). This EUA will remain in effect (meaning this test can be used) for the duration of the COVID-19 declaration under Section 564(b)(1) of the Act, 21 U.S.C. section 360bbb-3(b)(1), unless the authorization is terminated or revoked.  Performed at [facility], [place] [address], [place], [place] [address]   Urine Culture     Status: Abnormal   Collection Time: 12/27/21 12:23 AM   Specimen: In/Out Cath Urine  Result Value Ref Range Status   Specimen Description   Final    IN/OUT CATH URINE Performed at [place] [address] [place], [place] [address]    Special Requests   Final    NONE Performed at [facility], [place] [address], [place], [place] [address]    Culture MULTIPLE SPECIES PRESENT, SUGGEST RECOLLECTION (A)  Final   Report Status 12/28/2021 FINAL  Final     Labs: BNP (last 3 results) No results for input(s): "BNP" in the last 8760 hours. Basic Metabolic Panel: Recent Labs  Lab 12/26/21 0049 12/26/21 2102 12/27/21 0510 12/28/21 0913 12/29/21 0550 12/30/21 0632  NA  --  134* 135 133* 136 139  K  --  3.4* 3.8 3.5 3.7 3.6  CL  --  101 100 102 106 107  CO2  --  '25 26 24 26 26  '$ GLUCOSE  --  112* 102* 101* 94 100*  BUN  --  '21 18 18 15 13  '$ CREATININE  --  1.35* 1.14 1.02 0.91 0.72  CALCIUM  --  9.1 8.8* 8.2* 8.2* 8.4*  MG 1.7  --  1.7  --   --   --   PHOS 3.0  --  2.9  --   --   --    Liver Function Tests: Recent Labs  Lab 12/26/21 2102 12/27/21 0510  AST 92* 105*  ALT 25 29  ALKPHOS 47 51  BILITOT 1.1 1.4*  PROT 6.8 7.3  ALBUMIN 3.7 3.9   No results for input(s): "LIPASE",  "AMYLASE" in the last 168 hours. No results for input(s): "AMMONIA" in the last 168 hours. CBC: Recent Labs  Lab 12/26/21 2102 12/27/21 0510  WBC 7.9 7.7  NEUTROABS 6.5  --   HGB 16.2 16.2  HCT 47.3 47.3  MCV 96.7 96.3  PLT 163 146*   Cardiac Enzymes: Recent Labs  Lab 12/26/21 0049 12/28/21 0913  CKTOTAL 3,399* 1,607*   BNP: Invalid input(s): "POCBNP" CBG: No results for input(s): "GLUCAP" in the last 168 hours. D-Dimer No results for input(s): "DDIMER" in  the last 72 hours. Hgb A1c No results for input(s): "HGBA1C" in the last 72 hours. Lipid Profile No results for input(s): "CHOL", "HDL", "LDLCALC", "TRIG", "CHOLHDL", "LDLDIRECT" in the last 72 hours. Thyroid function studies No results for input(s): "TSH", "T4TOTAL", "T3FREE", "THYROIDAB" in the last 72 hours.  Invalid input(s): "FREET3" Anemia work up No results for input(s): "VITAMINB12", "FOLATE", "FERRITIN", "TIBC", "IRON", "RETICCTPCT" in the last 72 hours. Urinalysis    Component Value Date/Time   COLORURINE YELLOW 12/27/2021 0023   APPEARANCEUR HAZY (A) 12/27/2021 0023   LABSPEC 1.025 12/27/2021 0023   PHURINE 5.5 12/27/2021 0023   GLUCOSEU NEGATIVE 12/27/2021 0023   HGBUR SMALL (A) 12/27/2021 0023   BILIRUBINUR NEGATIVE 12/27/2021 0023   KETONESUR TRACE (A) 12/27/2021 0023   PROTEINUR NEGATIVE 12/27/2021 0023   NITRITE NEGATIVE 12/27/2021 0023   LEUKOCYTESUR NEGATIVE 12/27/2021 0023   Sepsis Labs Recent Labs  Lab 12/26/21 2102 12/27/21 0510  WBC 7.9 7.7   Microbiology Recent Results (from the past 240 hour(s))  Resp panel by RT-PCR (RSV, Flu A&B, Covid) Anterior Nasal Swab     Status: Abnormal   Collection Time: 12/26/21  9:09 PM   Specimen: Anterior Nasal Swab  Result Value Ref Range Status   SARS Coronavirus 2 by RT PCR NEGATIVE NEGATIVE Final    Comment: (NOTE) SARS-CoV-2 target nucleic acids are NOT DETECTED.  The SARS-CoV-2 RNA is generally detectable in upper respiratory specimens  during the acute phase of infection. The lowest concentration of SARS-CoV-2 viral copies this assay can detect is 138 copies/mL. A negative result does not preclude SARS-Cov-2 infection and should not be used as the sole basis for treatment or other patient management decisions. A negative result may occur with  improper specimen collection/handling, submission of specimen other than nasopharyngeal swab, presence of viral mutation(s) within the areas targeted by this assay, and inadequate number of viral copies(<138 copies/mL). A negative result must be combined with clinical observations, patient history, and epidemiological information. The expected result is Negative.  Fact Sheet for Patients:  EntrepreneurPulse.com.au  Fact Sheet for Healthcare Providers:  IncredibleEmployment.be  This test is no t yet approved or cleared by the [place] FDA and  has been authorized for detection and/or diagnosis of SARS-CoV-2 by FDA under an Emergency Use Authorization (EUA). This EUA will remain  in effect (meaning this test can be used) for the duration of the COVID-19 declaration under Section 564(b)(1) of the Act, 21 U.S.C.section 360bbb-3(b)(1), unless the authorization is terminated  or revoked sooner.       Influenza A by PCR POSITIVE (A) NEGATIVE Final   Influenza B by PCR NEGATIVE NEGATIVE Final    Comment: (NOTE) The Xpert Xpress SARS-CoV-2/FLU/RSV plus assay is intended as an aid in the diagnosis of influenza from Nasopharyngeal swab specimens and should not be used as a sole basis for treatment. Nasal washings and aspirates are unacceptable for Xpert Xpress SARS-CoV-2/FLU/RSV testing.  Fact Sheet for Patients: EntrepreneurPulse.com.au  Fact Sheet for Healthcare Providers: IncredibleEmployment.be  This test is not yet approved or cleared by the [place] FDA and has been authorized for detection  and/or diagnosis of SARS-CoV-2 by FDA under an Emergency Use Authorization (EUA). This EUA will remain in effect (meaning this test can be used) for the duration of the COVID-19 declaration under Section 564(b)(1) of the Act, 21 U.S.C. section 360bbb-3(b)(1), unless the authorization is terminated or revoked.     Resp Syncytial Virus by PCR NEGATIVE NEGATIVE Final    Comment: (NOTE)  Fact Sheet for Patients: EntrepreneurPulse.com.au  Fact Sheet for Healthcare Providers: IncredibleEmployment.be  This test is not yet approved or cleared by the [place] FDA and has been authorized for detection and/or diagnosis of SARS-CoV-2 by FDA under an Emergency Use Authorization (EUA). This EUA will remain in effect (meaning this test can be used) for the duration of the COVID-19 declaration under Section 564(b)(1) of the Act, 21 U.S.C. section 360bbb-3(b)(1), unless the authorization is terminated or revoked.  Performed at [facility], [place] [address] [place], [place] [address]   Urine Culture     Status: Abnormal   Collection Time: 12/27/21 12:23 AM   Specimen: In/Out Cath Urine  Result Value Ref Range Status   Specimen Description   Final    IN/OUT CATH URINE Performed at [place] [address] [place], [place] [address]    Special Requests   Final    NONE Performed at [facility], [place] [address], [place], [place] [address]    Culture MULTIPLE SPECIES PRESENT, SUGGEST RECOLLECTION (A)  Final   Report Status 12/28/2021 FINAL  Final     SIGNED:   Charlynne Cousins, MD  [facility] 12/30/2021, 2:52 PM Pager   If 7PM-7AM, please contact night-coverage [email or web address] Password [email or web address]

## 2021-12-30 NOTE — Progress Notes (Addendum)
Physical Therapy Treatment Patient Details Name: Corey Kelley MRN: [id number] DOB: 1927/06/07 Today's Date: 12/30/2021   History of Present Illness Patient is a 88 year old male who presented with coughing. Patient was admitted with AKI, rhabdomyolysis,and influenza A.PMH: dementia, HTN, BPH    PT Comments    Pt is progressing this date. Incr activity tolerance/incr gait distance. SpO2=93-97% on RA. Pt is pleasant and cooperative, follows one step commands with incr time,  remains at risk for falls d/t weakness/deconditioning/diminished balance and will benefit from SNF post acute    Recommendations for follow up therapy are one component of a multi-disciplinary discharge planning process, led by the attending physician.  Recommendations may be updated based on patient status, additional functional criteria and insurance authorization.  Follow Up Recommendations  Skilled nursing-short term rehab (<3 hours/day) Can patient physically be transported by private vehicle: No   Assistance Recommended at Discharge Frequent or constant Supervision/Assistance  Patient can return home with the following A lot of help with walking and/or transfers;A lot of help with bathing/dressing/bathroom;Assistance with cooking/housework;Direct supervision/assist for medications management;Direct supervision/assist for financial management;Help with stairs or ramp for entrance;Assist for transportation   Equipment Recommendations  Other (comment) (defer to SNF)    Recommendations for Other Services       Precautions / Restrictions Precautions Precautions: Fall Precaution Comments: monitor O2 and RR Restrictions Weight Bearing Restrictions: No     Mobility  Bed Mobility Overal bed mobility: Needs Assistance Bed Mobility: Supine to Sit     Supine to sit: Mod assist, +2 for physical assistance, +2 for safety/equipment     General bed mobility comments: assist to progress LEs off bed, and elevate  trunk; incr time to scoot to EOB once in sitting    Transfers Overall transfer level: Needs assistance Equipment used: Rolling walker (2 wheels) Transfers: Sit to/from Stand Sit to Stand: Min assist           General transfer comment: cues for hand placement and anterior wt shift. assist to rise and stabilize in standing    Ambulation/Gait Ambulation/Gait assistance: Min assist, +2 safety/equipment Gait Distance (Feet): 25 Feet Assistive device: Rolling walker (2 wheels) Gait Pattern/deviations: Step-through pattern, Decreased stride length, Trunk flexed       General Gait Details: assist to balance and maneuver RW. verbal cues for step length, RW position and  trunk extension   Stairs             Wheelchair Mobility    Modified Rankin (Stroke Patients Only)       Balance   Sitting-balance support: Feet supported, Bilateral upper extremity supported, Single extremity supported Sitting balance-Leahy Scale: Fair     Standing balance support: During functional activity, Reliant on assistive device for balance, Bilateral upper extremity supported Standing balance-Leahy Scale: Poor                              Cognition Arousal/Alertness: Awake/alert Behavior During Therapy: WFL for tasks assessed/performed Overall Cognitive Status: History of cognitive impairments - at baseline                                 General Comments: pleasant and follows cues/commands well. pt alert to self and place (hospital).        Exercises General Exercises - Lower Extremity Ankle Circles/Pumps: AROM, Both, 5 reps    General Comments  Pertinent Vitals/Pain Pain Assessment Pain Assessment: No/denies pain Faces Pain Scale: No hurt    Home Living                          Prior Function            PT Goals (current goals can now be found in the care plan section) Acute Rehab PT Goals Patient Stated Goal: none  stated PT Goal Formulation: Patient unable to participate in goal setting Time For Goal Achievement: 01/10/22 Potential to Achieve Goals: Fair Progress towards PT goals: Progressing toward goals    Frequency    Min 3X/week      PT Plan Current plan remains appropriate    Co-evaluation              AM-PAC PT "6 Clicks" Mobility   Outcome Measure  Help needed turning from your back to your side while in a flat bed without using bedrails?: A Little Help needed moving from lying on your back to sitting on the side of a flat bed without using bedrails?: A Lot Help needed moving to and from a bed to a chair (including a wheelchair)?: A Lot Help needed standing up from a chair using your arms (e.g., wheelchair or bedside chair)?: A Lot Help needed to walk in hospital room?: A Little Help needed climbing 3-5 steps with a railing? : Total 6 Click Score: 13    End of Session Equipment Utilized During Treatment: Gait belt Activity Tolerance: Patient tolerated treatment well Patient left: in chair;with call bell/phone within reach;with chair alarm set;with family/visitor present Nurse Communication: Mobility status PT Visit Diagnosis: Muscle weakness (generalized) (M62.81);Difficulty in walking, not elsewhere classified (R26.2);Other abnormalities of gait and mobility (R26.89);History of falling (Z91.81)     Time: 8032-1224 PT Time Calculation (min) (ACUTE ONLY): 15 min  Charges:  $Gait Training: 8-22 mins                     Baxter Flattery, PT  Acute Rehab Dept [facility]) [phone]  [facility] Weekend Pager [facility] only)  [phone]  12/30/2021    [facility] 12/30/2021, 12:35 PM

## 2021-12-30 NOTE — Progress Notes (Signed)
PHARMACY NOTE:  ANTIMICROBIAL RENAL DOSAGE ADJUSTMENT  Current antimicrobial regimen includes a mismatch between antimicrobial dosage and estimated renal function.  As per policy approved by the Pharmacy & Therapeutics and Medical Executive Committees, the antimicrobial dosage will be adjusted accordingly.  Current antimicrobial dosage:   - Tamiflu 30 mg PO BID x 5 days   Indication: influenza  Renal Function:  Estimated Creatinine Clearance: 63.5 mL/min (by C-G formula based on SCr of 0.72 mg/dL). '[]'$      On intermittent HD, scheduled: '[]'$      On CRRT    Antimicrobial dosage has been changed to:   - Tamiflu 75 mg PO BID  - pt has been given 6 doses so far. Will enter order for 4 more doses   Additional comments:   Thank you for allowing pharmacy to be a part of this patient's care.   Royetta Asal, PharmD, BCPS 12/30/2021 8:43 AM

## 2021-12-30 NOTE — Progress Notes (Addendum)
[facility] PROGRESS NOTE    Progress Note  Corey Kelley  MWN:[id number] DOB: 06/11/1927 DOA: 12/26/2021 PCP: Corey Dy, MD     Brief Narrative:   Corey Kelley is an 88 y.o. male past medical history significant for dementia, hypertension BPH comes in with falls started on the day of admission denies any prodromal symptoms except for coughing.,  Family relates he has had lower extremity edema right more than the left patient is unable to provide a history due to his dementia.  The family also relates that the wife has been sick   Assessment/Plan:   AKI (acute kidney injury) [facility]): Probably prerenal azotemia in the setting of ACE inhibitor and Norvasc. Resolved with IV fluids and holding antihypertensive medication. Speech evaluation was done, he is moderate to high risk for aspiration. PT evaluated the patient recommended skilled nursing facility. Awaiting skilled nursing facility placement.  Rhabdomyolysis: Improved with IV fluid resuscitation CK 1600 this morning.  Influenza A: Appetite improved. On Tamiflu p.o. for 5 days. Speech evaluated the patient moderate to high risk of aspiration.  Essential hypertension Controlled. Continue to hold antihypertensive medication.  Hypokalemia: Replete orally now resolved.  Magnesium level is 1.7.  Right lower extremity edema: Film x-ray showed no acute findings lower extremity Dopplers negative for DVT. There is no signs of infection.  Acute urinary retention/BPH: Was found to have 600 cc on bladder scan was continue on Flomax. Foley catheter has been inserted. Will give him a voiding trial in 2 days.  Advance dementia: Currently on no medications.   DVT prophylaxis: lovenox Family Communication:none Status is: Observation The patient will require care spanning > 2 midnights and should be moved to inpatient because: Acute kidney injury and rhabdomyolysis    Code Status:     Code Status Orders  (From  admission, onward)           Start     Ordered   12/26/21 2310  Full code  Continuous       Question:  By:  Answer:  Consent: discussion documented in EHR   12/26/21 2310           Code Status History     This patient has a current code status but no historical code status.         IV Access:   Peripheral IV   Procedures and diagnostic studies:   DG Swallowing Func-Speech Pathology  Result Date: 12/29/2021 Table formatting from the original result was not included. Objective Swallowing Evaluation: Type of Study: Bedside Swallow Evaluation  Patient Details Name: Corey Kelley MRN: [id number] Date of Birth: Jun 17, 1927 Today's Date: 12/29/2021 Time: SLP Start Time (ACUTE ONLY): 0854 -SLP Stop Time (ACUTE ONLY): 0913 SLP Time Calculation (min) (ACUTE ONLY): 19 min Past Medical History: Past Medical History: Diagnosis Date  Hypercholesteremia   Hypertension   Prostate nodule  Past Surgical History: Past Surgical History: Procedure Laterality Date  HERNIA REPAIR  1950's HPI: Corey Kelley is a 88 y.o. male with medical history significant of Dementia, HTN, HLD, BPH        Presented with  falls, fatigue  Pt have had 3 falls today had to call EMS to get him up from the ground  Not sure if he hit his head  BP 146/72  Denies CP , SOB or fever but has been coughing    Has a right leg swelling    Family feels that both legs have been swollen for months but right more than left  It has been interfering with his walking   Pt unable to provide hx   But no fever no diarrhea   He used to smoke but no any more does not drink  His wife has been sick recently as well; 12/28 CXR indicated The appearance the chest suggests progressive worsening severe  bronchitis, as above.  Nursing informed SLP pt "coughing with liquids and pocketing food" and requiring breathing tx from RT.  BSE generated.  Subjective: pt awake in chair  Recommendations for follow up therapy are one component of a multi-disciplinary  discharge planning process, led by the attending physician.  Recommendations may be updated based on patient status, additional functional criteria and insurance authorization. Assessment / Plan / Recommendation   12/29/2021   9:25 AM Clinical Impressions Clinical Impression Patient presents with mild oropharyngeal dysphagia with obstructive component due to appearance of cervical osteophyte impacting pharyngeal clearance resulting in mild pharyngeal residuals (that mix with secretions) across all consistencies.  Pt with impaired sensation to retention when minimal but conducts reflexive dry swallows to help decrease.   Following solid with liquids also helpful to decrease retention.  Prolonged oral transiting of pudding with lingual pumping noted due to mild weakness.   Barium tablet taken with thin momentarily stalled at valleculae and pyriform sinus, requiring several sequential boluses of thin to clear. Laryngeal penetration of thin when taking barium tablet observed, thus for safety, recommend medications be taken with puree- cut or crush if large and follow with liquids.  Patient educated to findings, recommendations.  Of note, his voice was occasional gurgly after MBS (when larynx was clear) - suspect some component of secretion aspiration. SLP Visit Diagnosis Dysphagia, oropharyngeal phase (R13.12) Impact on safety and function Moderate aspiration risk     12/29/2021   9:25 AM Treatment Recommendations Treatment Recommendations Defer until completion of intrumental exam     12/29/2021   9:26 AM Prognosis Prognosis for Safe Diet Advancement Fair Barriers to Reach Goals Cognitive deficits   12/29/2021   9:25 AM Diet Recommendations SLP Diet Recommendations Dysphagia 3 (Mech soft) solids;Thin liquid Liquid Administration via Cup;Straw Medication Administration Other (Comment) Compensations Slow rate;Small sips/bites;Follow solids with liquid Postural Changes Remain semi-upright after after feeds/meals  (Comment);Seated upright at 90 degrees     12/29/2021   9:25 AM Other Recommendations Oral Care Recommendations Oral care BID Follow Up Recommendations Follow physician's recommendations for discharge plan and follow up therapies Functional Status Assessment Patient has had a recent decline in their functional status and demonstrates the ability to make significant improvements in function in a reasonable and predictable amount of time.   12/29/2021   9:25 AM Frequency and Duration  Speech Therapy Frequency (ACUTE ONLY) min 2x/week Treatment Duration 1 week     12/29/2021   9:18 AM Oral Phase Oral Phase Impaired Oral - Nectar Teaspoon WFL Oral - Nectar Cup WFL Oral - Nectar Straw WFL Oral - Thin Teaspoon WFL Oral - Thin Cup WFL Oral - Thin Straw WFL Oral - Puree Decreased bolus cohesion;Lingual pumping;Delayed oral transit Oral - Mech Soft Decreased bolus cohesion Oral - Pill Decreased bolus cohesion    12/29/2021   9:20 AM Pharyngeal Phase Pharyngeal Phase Impaired Pharyngeal- Nectar Teaspoon Pharyngeal residue - valleculae Pharyngeal Material does not enter airway Pharyngeal- Nectar Cup Pharyngeal residue - valleculae Pharyngeal Material does not enter airway Pharyngeal- Nectar Straw Pharyngeal residue - valleculae;Pharyngeal residue - pyriform Pharyngeal Material does not enter airway Pharyngeal- Thin Teaspoon Pharyngeal residue - valleculae Pharyngeal Material does  not enter airway Pharyngeal- Thin Cup Pharyngeal residue - valleculae Pharyngeal Material does not enter airway Pharyngeal- Thin Straw Pharyngeal residue - valleculae Pharyngeal Material does not enter airway Pharyngeal- Puree Delayed swallow initiation-vallecula;Pharyngeal residue - valleculae;Pharyngeal residue - pyriform Pharyngeal Material does not enter airway Pharyngeal- Mechanical Soft Pharyngeal residue - valleculae Pharyngeal Material does not enter airway Pharyngeal- Pill Pharyngeal residue - valleculae;Pharyngeal residue - pyriform  Pharyngeal Material does not enter airway    12/29/2021   9:25 AM Cervical Esophageal Phase  Cervical Esophageal Phase Impaired Kathleen Lime, MS [facility] SLP Acute Rehab Services Office [phone] Pager [phone] Macario Golds 12/29/2021, 9:43 AM                       Medical Consultants:   None.   Subjective:    Corey Kelley no complaints today.  Objective:    Vitals:   12/29/21 1401 12/29/21 2324 12/30/21 0602 12/30/21 0830  BP:  (!) 144/86 115/64   Pulse:  77 73   Resp:  18 20   Temp:  97.9 F (36.6 C) 98.3 F (36.8 C)   TempSrc:  Oral Oral   SpO2: 98% 100% 93% 94%  Weight:      Height:       SpO2: 94 % O2 Flow Rate (L/min): 2 L/min FiO2 (%): 28 %   Intake/Output Summary (Last 24 hours) at 12/30/2021 0847 Last data filed at 12/30/2021 0600 Gross per 24 hour  Intake 1021.06 ml  Output 1100 ml  Net -78.94 ml    Filed Weights   12/27/21 2350  Weight: 91.4 kg    Exam: General exam: In no acute distress. Respiratory system: Good air movement and clear to auscultation. Cardiovascular system: S1 & S2 heard, RRR. No JVD. Gastrointestinal system: Abdomen is nondistended, soft and nontender.  Extremities: No pedal edema. Skin: No rashes, lesions or ulcers  Data Reviewed:    Labs: Basic Metabolic Panel: Recent Labs  Lab 12/26/21 0049 12/26/21 2102 12/26/21 2102 12/27/21 0510 12/28/21 0913 12/29/21 0550 12/30/21 0632  NA  --  134*  --  135 133* 136 139  K  --  3.4*   < > 3.8 3.5 3.7 3.6  CL  --  101  --  100 102 106 107  CO2  --  25  --  '26 24 26 26  '$ GLUCOSE  --  112*  --  102* 101* 94 100*  BUN  --  21  --  '18 18 15 13  '$ CREATININE  --  1.35*  --  1.14 1.02 0.91 0.72  CALCIUM  --  9.1  --  8.8* 8.2* 8.2* 8.4*  MG 1.7  --   --  1.7  --   --   --   PHOS 3.0  --   --  2.9  --   --   --    < > = values in this interval not displayed.    GFR Estimated Creatinine Clearance: 63.5 mL/min (by C-G formula based on SCr of 0.72 mg/dL). Liver Function  Tests: Recent Labs  Lab 12/26/21 2102 12/27/21 0510  AST 92* 105*  ALT 25 29  ALKPHOS 47 51  BILITOT 1.1 1.4*  PROT 6.8 7.3  ALBUMIN 3.7 3.9    No results for input(s): "LIPASE", "AMYLASE" in the last 168 hours. No results for input(s): "AMMONIA" in the last 168 hours. Coagulation profile Recent Labs  Lab 12/26/21 2102  INR 1.1    COVID-19  Labs  No results for input(s): "DDIMER", "FERRITIN", "LDH", "CRP" in the last 72 hours.   Lab Results  Component Value Date   SARSCOV2NAA NEGATIVE 12/26/2021   [place] NEGATIVE 01/26/2020    CBC: Recent Labs  Lab 12/26/21 2102 12/27/21 0510  WBC 7.9 7.7  NEUTROABS 6.5  --   HGB 16.2 16.2  HCT 47.3 47.3  MCV 96.7 96.3  PLT 163 146*    Cardiac Enzymes: Recent Labs  Lab 12/26/21 0049 12/28/21 0913  CKTOTAL 3,399* 1,607*    BNP (last 3 results) No results for input(s): "PROBNP" in the last 8760 hours. CBG: No results for input(s): "GLUCAP" in the last 168 hours. D-Dimer: No results for input(s): "DDIMER" in the last 72 hours.  Hgb A1c: No results for input(s): "HGBA1C" in the last 72 hours. Lipid Profile: No results for input(s): "CHOL", "HDL", "LDLCALC", "TRIG", "CHOLHDL", "LDLDIRECT" in the last 72 hours. Thyroid function studies: No results for input(s): "TSH", "T4TOTAL", "T3FREE", "THYROIDAB" in the last 72 hours.  Invalid input(s): "FREET3"  Anemia work up: No results for input(s): "VITAMINB12", "FOLATE", "FERRITIN", "TIBC", "IRON", "RETICCTPCT" in the last 72 hours. Sepsis Labs: Recent Labs  Lab 12/26/21 2102 12/27/21 0049 12/27/21 0510  WBC 7.9  --  7.7  LATICACIDVEN 1.8 1.4  --     Microbiology Recent Results (from the past 240 hour(s))  Resp panel by RT-PCR (RSV, Flu A&B, Covid) Anterior Nasal Swab     Status: Abnormal   Collection Time: 12/26/21  9:09 PM   Specimen: Anterior Nasal Swab  Result Value Ref Range Status   SARS Coronavirus 2 by RT PCR NEGATIVE NEGATIVE Final    Comment:  (NOTE) SARS-CoV-2 target nucleic acids are NOT DETECTED.  The SARS-CoV-2 RNA is generally detectable in upper respiratory specimens during the acute phase of infection. The lowest concentration of SARS-CoV-2 viral copies this assay can detect is 138 copies/mL. A negative result does not preclude SARS-Cov-2 infection and should not be used as the sole basis for treatment or other patient management decisions. A negative result may occur with  improper specimen collection/handling, submission of specimen other than nasopharyngeal swab, presence of viral mutation(s) within the areas targeted by this assay, and inadequate number of viral copies(<138 copies/mL). A negative result must be combined with clinical observations, patient history, and epidemiological information. The expected result is Negative.  Fact Sheet for Patients:  EntrepreneurPulse.com.au  Fact Sheet for Healthcare Providers:  IncredibleEmployment.be  This test is no t yet approved or cleared by the [place] FDA and  has been authorized for detection and/or diagnosis of SARS-CoV-2 by FDA under an Emergency Use Authorization (EUA). This EUA will remain  in effect (meaning this test can be used) for the duration of the COVID-19 declaration under Section 564(b)(1) of the Act, 21 U.S.C.section 360bbb-3(b)(1), unless the authorization is terminated  or revoked sooner.       Influenza A by PCR POSITIVE (A) NEGATIVE Final   Influenza B by PCR NEGATIVE NEGATIVE Final    Comment: (NOTE) The Xpert Xpress SARS-CoV-2/FLU/RSV plus assay is intended as an aid in the diagnosis of influenza from Nasopharyngeal swab specimens and should not be used as a sole basis for treatment. Nasal washings and aspirates are unacceptable for Xpert Xpress SARS-CoV-2/FLU/RSV testing.  Fact Sheet for Patients: EntrepreneurPulse.com.au  Fact Sheet for Healthcare  Providers: IncredibleEmployment.be  This test is not yet approved or cleared by the [place] FDA and has been authorized for detection and/or diagnosis of SARS-CoV-2 by  FDA under an Emergency Use Authorization (EUA). This EUA will remain in effect (meaning this test can be used) for the duration of the COVID-19 declaration under Section 564(b)(1) of the Act, 21 U.S.C. section 360bbb-3(b)(1), unless the authorization is terminated or revoked.     Resp Syncytial Virus by PCR NEGATIVE NEGATIVE Final    Comment: (NOTE) Fact Sheet for Patients: EntrepreneurPulse.com.au  Fact Sheet for Healthcare Providers: IncredibleEmployment.be  This test is not yet approved or cleared by the [place] FDA and has been authorized for detection and/or diagnosis of SARS-CoV-2 by FDA under an Emergency Use Authorization (EUA). This EUA will remain in effect (meaning this test can be used) for the duration of the COVID-19 declaration under Section 564(b)(1) of the Act, 21 U.S.C. section 360bbb-3(b)(1), unless the authorization is terminated or revoked.  Performed at [facility], [place] [address], [place], [place] [address]   Urine Culture     Status: Abnormal   Collection Time: 12/27/21 12:23 AM   Specimen: In/Out Cath Urine  Result Value Ref Range Status   Specimen Description   Final    IN/OUT CATH URINE Performed at [place] [address], [place], [place] [address]    Special Requests   Final    NONE Performed at [facility], [place] [address] [place], [place] [address]    Culture MULTIPLE SPECIES PRESENT, SUGGEST RECOLLECTION (A)  Final   Report Status 12/28/2021 FINAL  Final     Medications:    albuterol  3 mL Inhalation TID   Chlorhexidine Gluconate Cloth  6 each Topical Daily   enoxaparin (LOVENOX) injection  40 mg Subcutaneous Q24H   feeding supplement  237  mL Oral BID BM   guaiFENesin  600 mg Oral BID   influenza vaccine adjuvanted  0.5 mL Intramuscular Tomorrow-1000   metoprolol tartrate  12.5 mg Oral BID   oseltamivir  75 mg Oral BID   tamsulosin  0.4 mg Oral QPC supper   Continuous Infusions:      LOS: 3 days   Charlynne Cousins  [facility]  12/30/2021, 8:47 AM

## 2021-12-30 NOTE — TOC Progression Note (Addendum)
Transition of Care [facility]) - Progression Note   Patient Details  Name: Corey Kelley MRN: [id number] Date of Birth: June 07, 1927  Transition of Care [facility]) CM/SW [place], LCSW Phone Number: 12/30/2021, 3:13 PM  Clinical Narrative: CSW confirmed bed with Kia in admissions at [facility]. CSW completed insurance authorization on [employer] portal. Plan auth # is: [id number]. Reference ID # is: [id number]. Patient has been approved for 12/30/2021-01/02/2022. CSW followed up with Kia, but the discharge was completed too late in the day so [facility] will accept the patient on 12/31/21. RN updated.  CSW spoke with daughter regarding discharge to SNF. Daughter continued to express concerns about the patient discharging to SNF so soon after being admitted and asked about other bed offers. Patient has not received additional bed offers at this time.  Expected Discharge Plan: [place] Barriers to Discharge: Continued Medical Work up, SNF Pending bed offer, Ship broker  Expected Discharge Plan and Services In-house Referral: Clinical Social Work Post Acute Care Choice: [place] Living arrangements for the past 2 months: [place] Expected Discharge Date: 12/30/21               DME Arranged: N/A DME Agency: NA  Social Determinants of Health (SDOH) Interventions SDOH Screenings   Tobacco Use: Medium Risk (12/27/2021)   Readmission Risk Interventions     No data to display

## 2021-12-31 DIAGNOSIS — I10 Essential (primary) hypertension: Secondary | ICD-10-CM | POA: Diagnosis not present

## 2021-12-31 DIAGNOSIS — N179 Acute kidney failure, unspecified: Secondary | ICD-10-CM | POA: Diagnosis not present

## 2021-12-31 DIAGNOSIS — J101 Influenza due to other identified influenza virus with other respiratory manifestations: Secondary | ICD-10-CM | POA: Diagnosis not present

## 2021-12-31 NOTE — Progress Notes (Signed)
Pt's wife asked nurse to open up a bottle of cough syrup, that she had, so that she could give the pt some. Nurse told her to not give him any medications that she may have and that he will be getting the medications that the doctor has prescribed for him, by the nurse. She stated okay.

## 2021-12-31 NOTE — TOC Transition Note (Signed)
Transition of Care [facility]) - CM/SW Discharge Note   Patient Details  Name: Corey Kelley MRN: [id number] Date of Birth: February 25, 1927  Transition of Care [facility] CM/SW Contact:  Vassie Moselle, LCSW Phone Number: 12/31/2021, 3:40 PM   Clinical Narrative:    Pt is to transfer over to [facility] for SNF. Pt will be going to room 126b. RN to call report to [phone]. CSW spoke with pt's daughter and confirmed [place] plans. Pt is to be transported to facility via [facility]    Final next level of care: [place] Barriers to Discharge: Barriers Resolved   Patient Goals and CMS Choice CMS Medicare.gov Compare Post Acute Care list provided to:: Patient Represenative (must comment) Choice offered to / list presented to : Adult Children  Discharge Placement PASRR number recieved: 12/28/21 PASRR number recieved: 12/28/21            Patient chooses bed at: [facility] Patient to be transferred to facility by: [place] Name of family member notified: Desiree Best Patient and family notified of of transfer: 12/31/21  Discharge Plan and Services Additional resources added to the After Visit Summary for   In-house Referral: Clinical Social Work   Post Acute Care Choice: [place]          DME Arranged: N/A DME Agency: NA                  Social Determinants of Health ([place]) Interventions SDOH Screenings   Tobacco Use: Medium Risk (12/27/2021)     Readmission Risk Interventions     No data to display

## 2022-03-02 DEATH — deceased

## 2022-08-29 IMAGING — CT CT HEAD W/O CM
4 series · 16 of 47 positions shown, 18 images · non-contrast
Comparison: 05/18/2016

CLINICAL DATA: Encephalopathy with loss of appetite

EXAM:
CT HEAD WITHOUT CONTRAST
TECHNIQUE: Contiguous axial images were obtained from the base of the skull
through the vertex without intravenous contrast.

[Series 3: head without · axial · non-contrast · 0.42mm/px · z∈[-90,+25]mm · 7 of 31 slices shown, 9 images]
[im 4/31  brain]
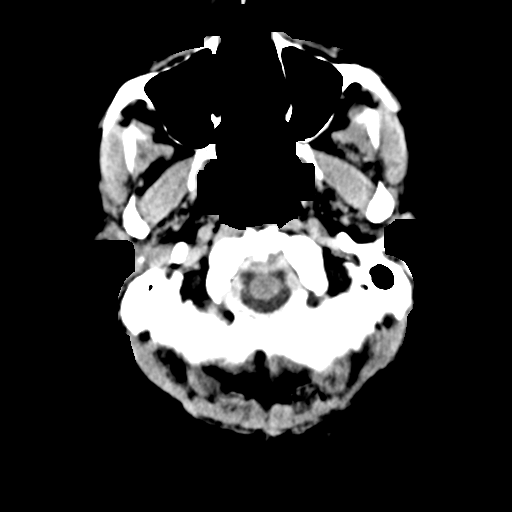
[im 4/31  bone]
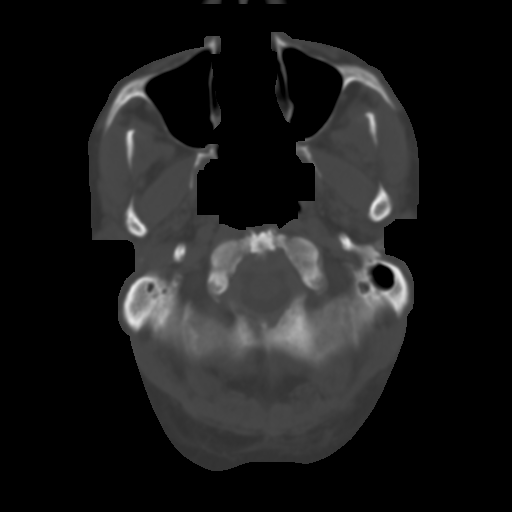
[im 8/31  brain]
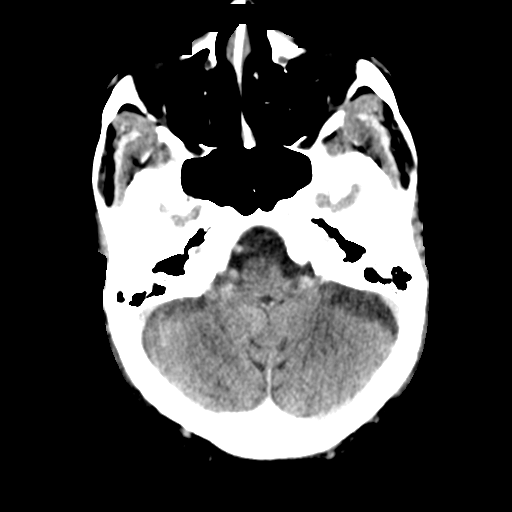
[im 12/31  brain]
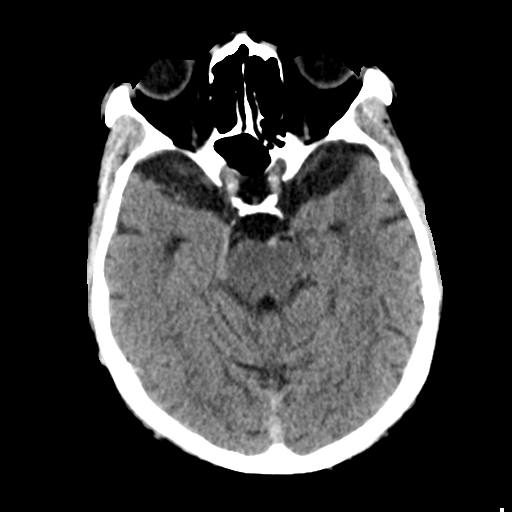
[im 16/31  brain]
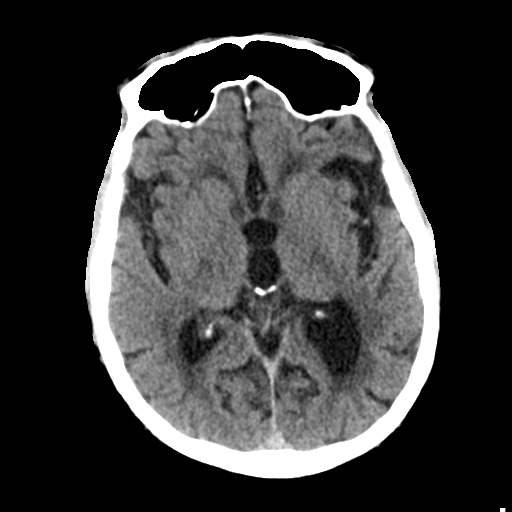
[im 19/31  brain]
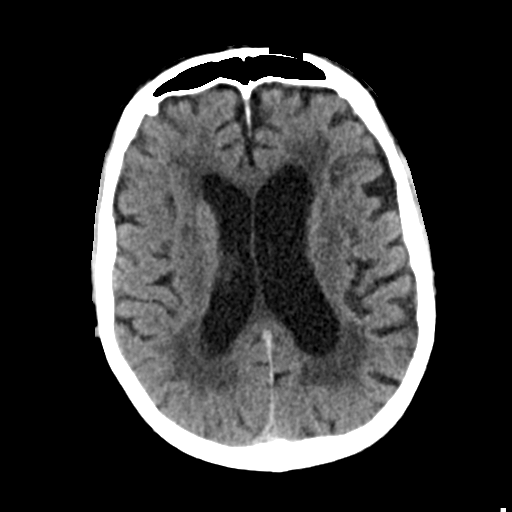
[im 19/31  bone]
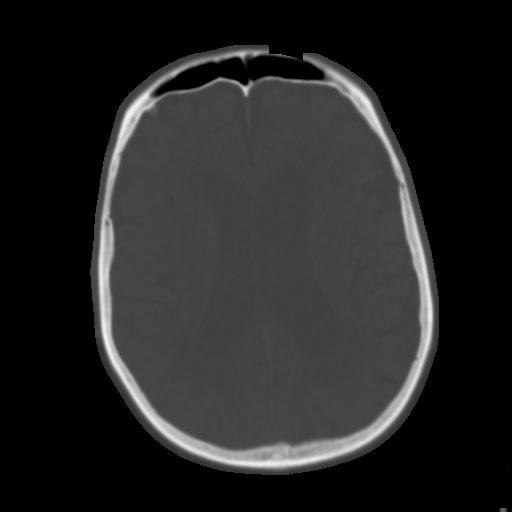
[im 23/31  brain]
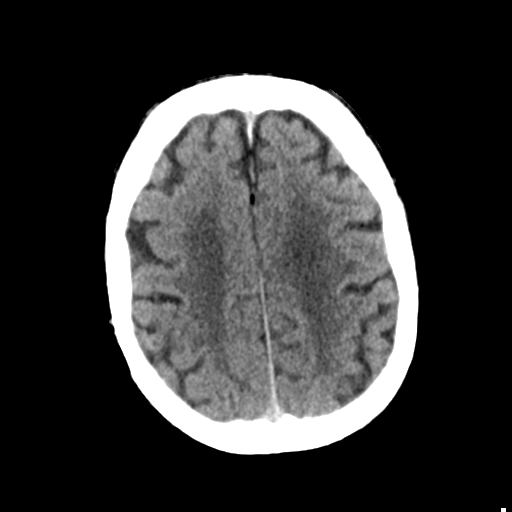
[im 27/31  brain]
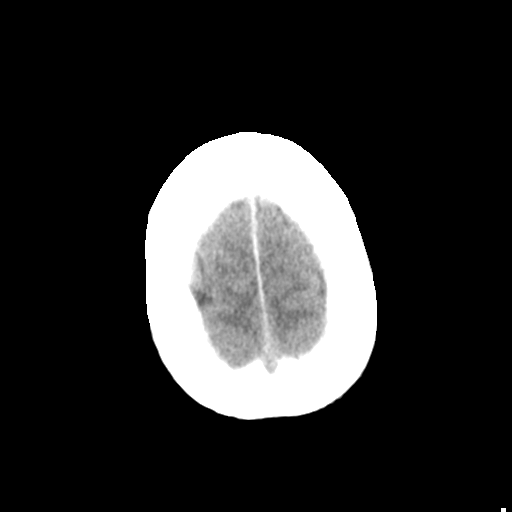

[Series 4: head bone · axial · 0.42mm/px · z∈[-91,-61]mm · 3 of 77 slices shown]
[im 8/77  bone]
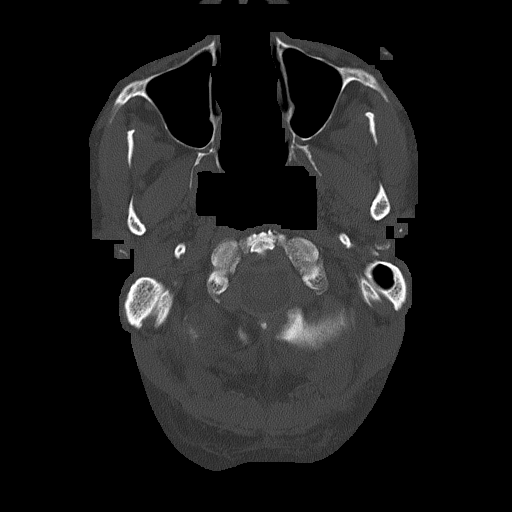
[im 16/77  bone]
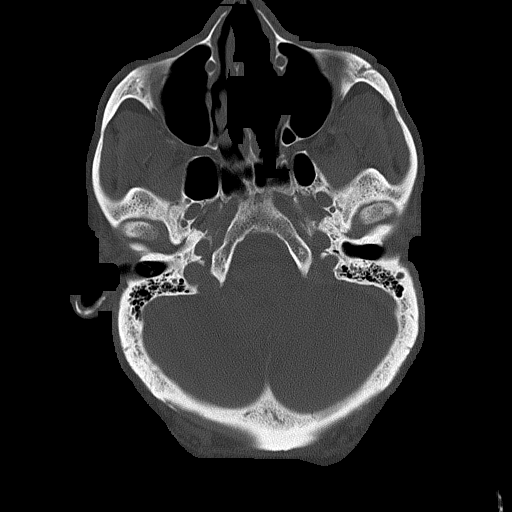
[im 23/77  bone]
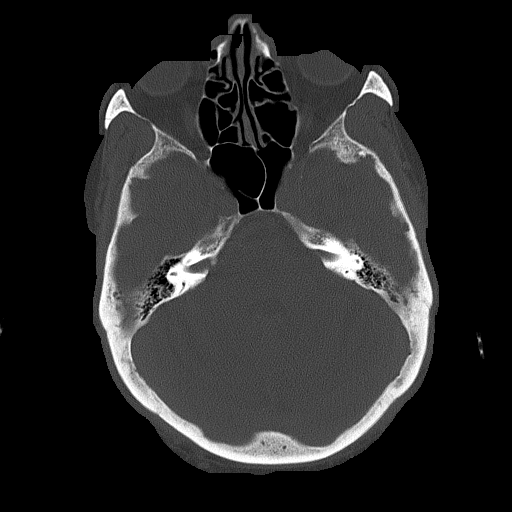

[Series 5: head without cor · coronal · non-contrast · 0.30mm/px · 3 of 66 slices shown]
[im 22/66  brain]
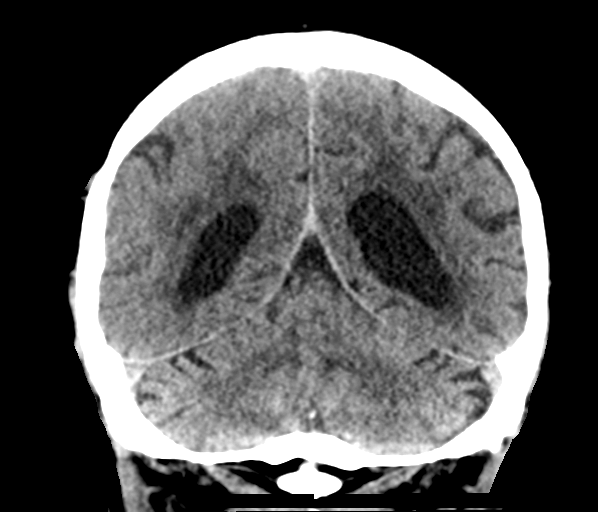
[im 29/66  brain]
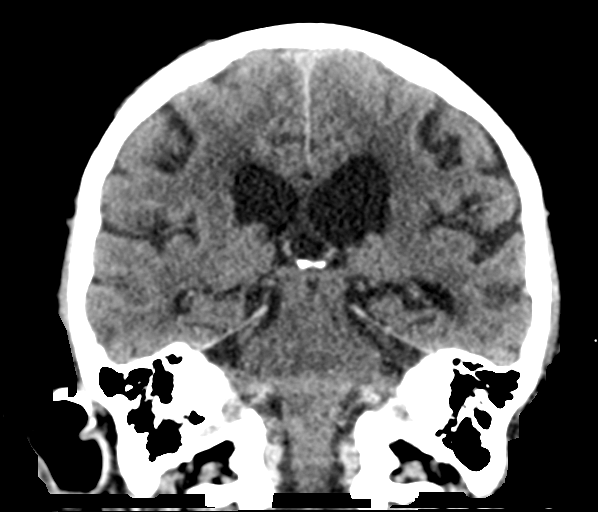
[im 37/66  brain]
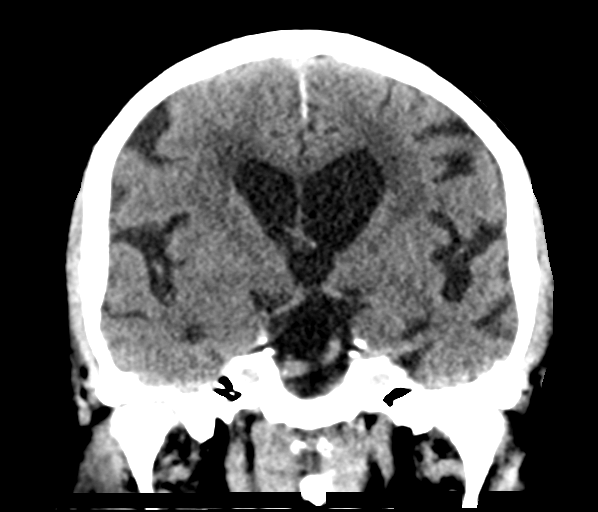

[Series 6: head without sag · sagittal · non-contrast · 0.30mm/px · 3 of 55 slices shown]
[im 19/55  brain]
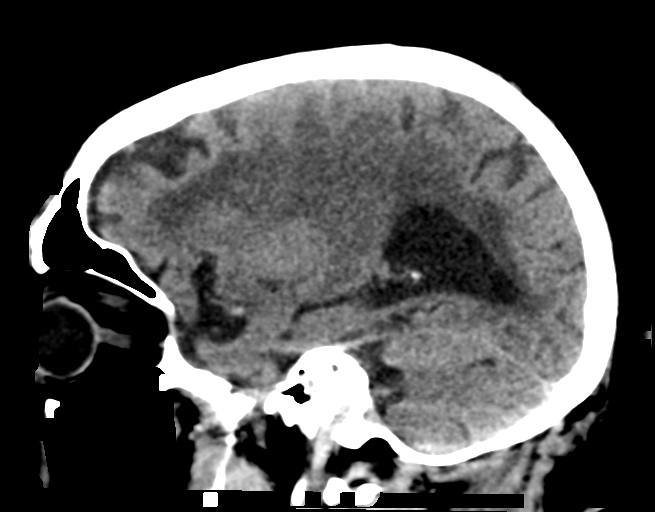
[im 28/55  brain]
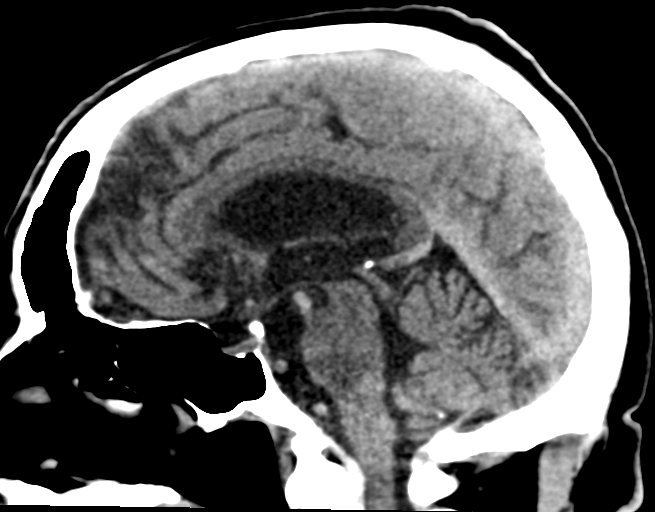
[im 37/55  brain]
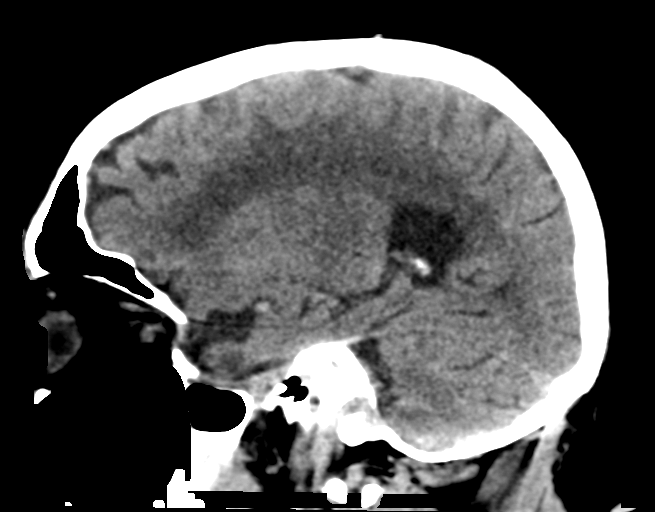

[16 of 47 positions shown; findings below may reference images not displayed]

FINDINGS: Brain: There is no mass, hemorrhage or extra-axial collection. There
is generalized atrophy without lobar predilection. Hypodensity of
the white matter is most commonly associated with chronic
microvascular disease.

Vascular: No abnormal hyperdensity of the major intracranial
arteries or dural venous sinuses. No intracranial atherosclerosis.

Skull: The visualized skull base, calvarium and extracranial soft
tissues are normal.

Sinuses/Orbits: No fluid levels or advanced mucosal thickening of
the visualized paranasal sinuses. No mastoid or middle ear effusion.
The orbits are normal.
IMPRESSION: Generalized atrophy and chronic microvascular ischemia without acute
intracranial abnormality.
